# Patient Record
Sex: Male | Born: 1975 | Race: Black or African American | Hispanic: No | Marital: Married | Smoking: Current every day smoker
Health system: Southern US, Community
[De-identification: ages and names within clinical notes are randomized; demographics above are authoritative.]

## PROBLEM LIST (undated history)

## (undated) DIAGNOSIS — I10 Essential (primary) hypertension: Secondary | ICD-10-CM

## (undated) DIAGNOSIS — T7840XA Allergy, unspecified, initial encounter: Secondary | ICD-10-CM

## (undated) HISTORY — DX: Allergy, unspecified, initial encounter: T78.40XA

---

## 2004-07-10 ENCOUNTER — Emergency Department: Payer: Self-pay | Admitting: Emergency Medicine

## 2004-11-01 ENCOUNTER — Ambulatory Visit: Payer: Self-pay | Admitting: Pain Medicine

## 2004-11-20 ENCOUNTER — Ambulatory Visit: Payer: Self-pay | Admitting: Pain Medicine

## 2005-01-15 ENCOUNTER — Ambulatory Visit: Payer: Self-pay | Admitting: Physician Assistant

## 2005-01-28 ENCOUNTER — Ambulatory Visit: Payer: Self-pay | Admitting: Physician Assistant

## 2005-01-29 ENCOUNTER — Ambulatory Visit: Payer: Self-pay | Admitting: Pain Medicine

## 2007-11-21 ENCOUNTER — Emergency Department: Payer: Self-pay | Admitting: Emergency Medicine

## 2008-08-11 ENCOUNTER — Emergency Department: Payer: Self-pay | Admitting: Emergency Medicine

## 2010-09-09 ENCOUNTER — Emergency Department: Payer: Self-pay | Admitting: Emergency Medicine

## 2011-01-27 ENCOUNTER — Emergency Department: Payer: Self-pay | Admitting: Emergency Medicine

## 2013-11-17 ENCOUNTER — Emergency Department: Payer: Self-pay | Admitting: Emergency Medicine

## 2013-11-17 LAB — COMPREHENSIVE METABOLIC PANEL
ALT: 72 U/L — AB
ANION GAP: 8 (ref 7–16)
AST: 40 U/L — AB (ref 15–37)
Albumin: 3.6 g/dL (ref 3.4–5.0)
Alkaline Phosphatase: 121 U/L — ABNORMAL HIGH
BILIRUBIN TOTAL: 0.4 mg/dL (ref 0.2–1.0)
BUN: 9 mg/dL (ref 7–18)
CREATININE: 1.01 mg/dL (ref 0.60–1.30)
Calcium, Total: 8.4 mg/dL — ABNORMAL LOW (ref 8.5–10.1)
Chloride: 103 mmol/L (ref 98–107)
Co2: 26 mmol/L (ref 21–32)
EGFR (African American): >60
EGFR (Non-African Amer.): >60
Glucose: 91 mg/dL (ref 65–99)
Osmolality: 272 (ref 275–301)
POTASSIUM: 3.4 mmol/L — AB (ref 3.5–5.1)
Sodium: 137 mmol/L (ref 136–145)
Total Protein: 7.7 g/dL (ref 6.4–8.2)

## 2013-11-17 LAB — URINALYSIS, COMPLETE
BACTERIA: NONE SEEN
Bilirubin,UR: NEGATIVE
Blood: NEGATIVE
Glucose,UR: NEGATIVE mg/dL (ref 0–75)
KETONE: NEGATIVE
LEUKOCYTE ESTERASE: NEGATIVE
Nitrite: NEGATIVE
Ph: 5 (ref 4.5–8.0)
Protein: NEGATIVE
RBC, UR: NONE SEEN /HPF (ref 0–5)
SQUAMOUS EPITHELIAL: NONE SEEN
Specific Gravity: 1.02 (ref 1.003–1.030)

## 2013-11-17 LAB — CBC WITH DIFFERENTIAL/PLATELET
BASOS ABS: 0 10*3/uL (ref 0.0–0.1)
BASOS PCT: 0.3 %
Eosinophil #: 0.4 10*3/uL (ref 0.0–0.7)
Eosinophil %: 3.2 %
HCT: 47.2 % (ref 40.0–52.0)
HGB: 15.4 g/dL (ref 13.0–18.0)
LYMPHS PCT: 24.4 %
Lymphocyte #: 3 10*3/uL (ref 1.0–3.6)
MCH: 27.1 pg (ref 26.0–34.0)
MCHC: 32.7 g/dL (ref 32.0–36.0)
MCV: 83 fL (ref 80–100)
MONO ABS: 1.2 x10 3/mm — AB (ref 0.2–1.0)
Monocyte %: 10 %
NEUTROS PCT: 62.1 %
Neutrophil #: 7.6 10*3/uL — ABNORMAL HIGH (ref 1.4–6.5)
Platelet: 391 10*3/uL (ref 150–440)
RBC: 5.7 10*6/uL (ref 4.40–5.90)
RDW: 14 % (ref 11.5–14.5)
WBC: 12.2 10*3/uL — ABNORMAL HIGH (ref 3.8–10.6)

## 2013-11-17 LAB — LIPASE, BLOOD: Lipase: 165 U/L (ref 73–393)

## 2016-05-31 ENCOUNTER — Emergency Department
Admission: EM | Admit: 2016-05-31 | Discharge: 2016-05-31 | Disposition: A | Discharge status: Home / Self Care | Payer: BLUE CROSS/BLUE SHIELD | Attending: Emergency Medicine | Admitting: Emergency Medicine

## 2016-05-31 ENCOUNTER — Emergency Department: Payer: BLUE CROSS/BLUE SHIELD

## 2016-05-31 DIAGNOSIS — F17210 Nicotine dependence, cigarettes, uncomplicated: Secondary | ICD-10-CM | POA: Insufficient documentation

## 2016-05-31 DIAGNOSIS — R51 Headache: Secondary | ICD-10-CM | POA: Diagnosis present

## 2016-05-31 DIAGNOSIS — I10 Essential (primary) hypertension: Secondary | ICD-10-CM | POA: Insufficient documentation

## 2016-05-31 DIAGNOSIS — R519 Headache, unspecified: Secondary | ICD-10-CM

## 2016-05-31 LAB — COMPREHENSIVE METABOLIC PANEL
ALT: 30 U/L (ref 17–63)
ANION GAP: 7 (ref 5–15)
AST: 29 U/L (ref 15–41)
Albumin: 3.8 g/dL (ref 3.5–5.0)
Alkaline Phosphatase: 72 U/L (ref 38–126)
BUN: 14 mg/dL (ref 6–20)
CALCIUM: 8.8 mg/dL — AB (ref 8.9–10.3)
CHLORIDE: 104 mmol/L (ref 101–111)
CO2: 26 mmol/L (ref 22–32)
Creatinine, Ser: 1.04 mg/dL (ref 0.61–1.24)
GFR calc non Af Amer: >60 mL/min (ref >60)
Glucose, Bld: 113 mg/dL — ABNORMAL HIGH (ref 65–99)
Potassium: 3.5 mmol/L (ref 3.5–5.1)
SODIUM: 137 mmol/L (ref 135–145)
Total Bilirubin: 0.8 mg/dL (ref 0.3–1.2)
Total Protein: 7.6 g/dL (ref 6.5–8.1)

## 2016-05-31 LAB — CBC
HCT: 43.2 % (ref 40.0–52.0)
Hemoglobin: 14.2 g/dL (ref 13.0–18.0)
MCH: 27 pg (ref 26.0–34.0)
MCHC: 32.7 g/dL (ref 32.0–36.0)
MCV: 82.4 fL (ref 80.0–100.0)
Platelets: 310 10*3/uL (ref 150–440)
RBC: 5.25 MIL/uL (ref 4.40–5.90)
RDW: 15.5 % — AB (ref 11.5–14.5)
WBC: 17.2 10*3/uL — ABNORMAL HIGH (ref 3.8–10.6)

## 2016-05-31 LAB — TROPONIN I

## 2016-05-31 MED ORDER — METOCLOPRAMIDE HCL 5 MG/ML IJ SOLN
INTRAMUSCULAR | Status: AC
  Administered 2016-05-31: 10 mg via INTRAVENOUS
  Filled 2016-05-31: qty 2

## 2016-05-31 MED ORDER — BUTALBITAL-APAP-CAFFEINE 50-325-40 MG PO TABS: 1.0000 | ORAL_TABLET | Freq: Four times a day (QID) | ORAL | 0 refills | Status: AC | PRN

## 2016-05-31 MED ORDER — DIPHENHYDRAMINE HCL 50 MG/ML IJ SOLN
INTRAMUSCULAR | Status: AC
  Administered 2016-05-31: 25 mg via INTRAVENOUS
  Filled 2016-05-31: qty 1

## 2016-05-31 MED ORDER — DIPHENHYDRAMINE HCL 50 MG/ML IJ SOLN
25.0000 mg | Freq: Once | INTRAMUSCULAR | Status: AC
  Administered 2016-05-31: 25 mg via INTRAVENOUS

## 2016-05-31 MED ORDER — METOCLOPRAMIDE HCL 5 MG/ML IJ SOLN
10.0000 mg | Freq: Once | INTRAMUSCULAR | Status: AC
  Administered 2016-05-31: 10 mg via INTRAVENOUS

## 2016-05-31 MED ORDER — KETOROLAC TROMETHAMINE 30 MG/ML IJ SOLN
30.0000 mg | Freq: Once | INTRAMUSCULAR | Status: AC
  Administered 2016-05-31: 30 mg via INTRAVENOUS

## 2016-05-31 MED ORDER — SODIUM CHLORIDE 0.9 % IV SOLN
1000.0000 mL | Freq: Once | INTRAVENOUS | Status: AC
  Administered 2016-05-31: 1000 mL via INTRAVENOUS

## 2016-05-31 MED ORDER — KETOROLAC TROMETHAMINE 30 MG/ML IJ SOLN
INTRAMUSCULAR | Status: AC
  Administered 2016-05-31: 30 mg via INTRAVENOUS
  Filled 2016-05-31: qty 1

## 2016-05-31 NOTE — ED Notes (Signed)
ED Provider at bedside. 

## 2016-05-31 NOTE — ED Triage Notes (Signed)
Patient c/o headache and hypertension beginning today. Pt denies hx of the same. Pt reports he woke with headache, it grew more severe as day passed, his employers took his BP at work at 169/120 and was sent home.

## 2016-05-31 NOTE — ED Notes (Signed)
Patient transported to CT 

## 2016-05-31 NOTE — ED Provider Notes (Signed)
[facility] Emergency Department Provider Note       Time seen: ----------------------------------------- 8:00 PM on 05/31/2016 -----------------------------------------     I have reviewed the triage vital signs and the nursing notes.   HISTORY   Chief Complaint Hypertension and Headache    HPI Curtis [name] is a 41 y.o. male who presents to the ED for headache and hypertension beginning today. Patient denies history of same. He reports he woke with headache and agreed more severe as the day passed. Pain seems to be worse in the left side of his head, he had blood pressure obtained at work which revealed blood pressure 169/120. He was sent home presents here for severe left-sided headache. He denies recent illness or other complaints. He does not have a known blood pressure problem.   History reviewed. No pertinent past medical history.  There are no active problems to display for this patient.   History reviewed. No pertinent surgical history.  Allergies Patient has no known allergies.  Social History Social History  Substance Use Topics  . Smoking status: Current Every Day Smoker    Packs/day: 1.00    Types: Cigarettes  . Smokeless tobacco: Never Used  . Alcohol use No     Comment: occassional some day drink    Review of Systems Constitutional: Negative for fever. Eyes: Negative for vision changes ENT:  Negative for congestion, sore throat Cardiovascular: Negative for chest pain. Respiratory: Negative for shortness of breath. Gastrointestinal: Negative for abdominal pain, vomiting and diarrhea. Genitourinary: Negative for dysuria. Musculoskeletal: Negative for back pain. Skin: Negative for rash. Neurological: Positive for headache  All systems negative/normal/unremarkable except as stated in the HPI  ____________________________________________   PHYSICAL EXAM:  VITAL SIGNS: ED Triage Vitals  Enc Vitals Group     BP  05/31/16 1918 (!) 169/96     Pulse Rate 05/31/16 1918 67     Resp 05/31/16 1918 18     Temp --      Temp src --      SpO2 05/31/16 1918 98 %     Weight 05/31/16 1919 275 lb (124.7 kg)     Height 05/31/16 1919 6\' 1"  (1.854 m)     Head Circumference --      Peak Flow --      Pain Score 05/31/16 1924 8     Pain Loc --      Pain Edu? --      Excl. in [place]? --     Constitutional: Alert and oriented. Well appearing and in no distress. Eyes: Conjunctivae are normal. PERRL. Normal extraocular movements.Mild photophobia is noted ENT   Head: Normocephalic and atraumatic.   Nose: No congestion/rhinnorhea.   Mouth/Throat: Mucous membranes are moist.   Neck: No stridor. Cardiovascular: Normal rate, regular rhythm. No murmurs, rubs, or gallops. Respiratory: Normal respiratory effort without tachypnea nor retractions. Breath sounds are clear and equal bilaterally. No wheezes/rales/rhonchi. Gastrointestinal: Soft and nontender. Normal bowel sounds Musculoskeletal: Nontender with normal range of motion in extremities. No lower extremity tenderness nor edema. Neurologic:  Normal speech and language. No gross focal neurologic deficits are appreciated.  Skin:  Skin is warm, dry and intact. No rash noted. Psychiatric: Mood and affect are normal. Speech and behavior are normal.  ____________________________________________  EKG: Interpreted by me. Sinus rhythm rate of 69 bpm, normal PR interval, normal QRS, normal QT. Incomplete right bundle branch block  ____________________________________________  ED COURSE:  Pertinent labs & imaging results that were available  during my care of the patient were reviewed by me and considered in my medical decision making (see chart for details). Patient presents for headache and hypertension, we will assess with labs and imaging as indicated.   Procedures ____________________________________________   LABS (pertinent positives/negatives)  Labs  Reviewed  CBC - Abnormal; Notable for the following:       Result Value   WBC 17.2 (*)    RDW 15.5 (*)    All other components within normal limits  COMPREHENSIVE METABOLIC PANEL - Abnormal; Notable for the following:    Glucose, Bld 113 (*)    Calcium 8.8 (*)    All other components within normal limits  TROPONIN I    RADIOLOGY Images were viewed by me  CT head Is unremarkable ____________________________________________  FINAL ASSESSMENT AND PLAN  Headache, hypertension  Plan: Patient's labs and imaging were dictated above. Patient had presented for headache and high blood pressure. The high blood pressure is likely secondary to the headache. As his headache resolved his blood pressure seemed to come down. I will prescribe some headache medication. He has leukocytosis of uncertain etiology. No other signs of infection. He is stable and referred to primary care for follow-up.   Earleen Newport, MD   Note: This note was generated in part or whole with voice recognition software. Voice recognition is usually quite accurate but there are transcription errors that can and very often do occur. I apologize for any typographical errors that were not detected and corrected.     Earleen Newport, MD 05/31/16 2053

## 2017-04-12 ENCOUNTER — Other Ambulatory Visit: Payer: Self-pay

## 2017-04-12 ENCOUNTER — Emergency Department
Admission: EM | Admit: 2017-04-12 | Discharge: 2017-04-12 | Disposition: A | Discharge status: Home / Self Care | Payer: BLUE CROSS/BLUE SHIELD | Attending: Emergency Medicine | Admitting: Emergency Medicine

## 2017-04-12 ENCOUNTER — Emergency Department: Payer: BLUE CROSS/BLUE SHIELD

## 2017-04-12 ENCOUNTER — Encounter: Payer: Self-pay | Admitting: Emergency Medicine

## 2017-04-12 DIAGNOSIS — Y929 Unspecified place or not applicable: Secondary | ICD-10-CM | POA: Insufficient documentation

## 2017-04-12 DIAGNOSIS — Y999 Unspecified external cause status: Secondary | ICD-10-CM | POA: Diagnosis not present

## 2017-04-12 DIAGNOSIS — Z79899 Other long term (current) drug therapy: Secondary | ICD-10-CM | POA: Diagnosis not present

## 2017-04-12 DIAGNOSIS — S3992XA Unspecified injury of lower back, initial encounter: Secondary | ICD-10-CM | POA: Diagnosis present

## 2017-04-12 DIAGNOSIS — S39012A Strain of muscle, fascia and tendon of lower back, initial encounter: Secondary | ICD-10-CM | POA: Diagnosis not present

## 2017-04-12 DIAGNOSIS — Y939 Activity, unspecified: Secondary | ICD-10-CM | POA: Insufficient documentation

## 2017-04-12 DIAGNOSIS — F17210 Nicotine dependence, cigarettes, uncomplicated: Secondary | ICD-10-CM | POA: Diagnosis not present

## 2017-04-12 DIAGNOSIS — X58XXXA Exposure to other specified factors, initial encounter: Secondary | ICD-10-CM | POA: Diagnosis not present

## 2017-04-12 LAB — URINALYSIS, COMPLETE (UACMP) WITH MICROSCOPIC
BACTERIA UA: NONE SEEN
BILIRUBIN URINE: NEGATIVE
Glucose, UA: NEGATIVE mg/dL
HGB URINE DIPSTICK: NEGATIVE
Ketones, ur: NEGATIVE mg/dL
LEUKOCYTES UA: NEGATIVE
NITRITE: NEGATIVE
PROTEIN: 30 mg/dL — AB
SPECIFIC GRAVITY, URINE: 1.03 (ref 1.005–1.030)
SQUAMOUS EPITHELIAL / LPF: NONE SEEN
pH: 5 (ref 5.0–8.0)

## 2017-04-12 MED ORDER — CYCLOBENZAPRINE HCL 10 MG PO TABS: 10.0000 mg | ORAL_TABLET | Freq: Three times a day (TID) | ORAL | 0 refills | Status: DC | PRN

## 2017-04-12 MED ORDER — KETOROLAC TROMETHAMINE 10 MG PO TABS: 10.0000 mg | ORAL_TABLET | Freq: Four times a day (QID) | ORAL | 0 refills | Status: DC | PRN

## 2017-04-12 MED ORDER — TRAMADOL HCL 50 MG PO TABS: 50.0000 mg | ORAL_TABLET | Freq: Four times a day (QID) | ORAL | 0 refills | Status: DC | PRN

## 2017-04-12 MED ORDER — KETOROLAC TROMETHAMINE 60 MG/2ML IM SOLN
60.0000 mg | Freq: Once | INTRAMUSCULAR | Status: AC
  Administered 2017-04-12: 60 mg via INTRAMUSCULAR
  Filled 2017-04-12: qty 2

## 2017-04-12 NOTE — ED Triage Notes (Signed)
Pt c/o right low back pain near buttocks radiating down right leg. Hx of injury to back several years ago while at work.  Pt states pain started on Thursday.  Pt sitting in wheelchair.  Pt states he was seen at [facility] and was given muscle relaxants and takes bayer back and body tablets as well. Pt states no relief.

## 2017-04-12 NOTE — ED Provider Notes (Signed)
[facility] Emergency Department Provider Note   ____________________________________________   First MD Initiated Contact with Patient 04/12/17 1646     (approximate)  I have reviewed the triage vital signs and the nursing notes.   HISTORY  Chief Complaint Back Pain    HPI Curtis [name] is a 42 y.o. male patient complain of left radicular back pain has increased in the last 2 days.  It was noted triage stated pain was on the right side.  Patient denies any provocative incident for complaint.  Patient today went to work regularly on Thursday was mild back pain that increased by the time he finished work.  Patient state he went to urgent care clinic and was given muscle relaxers and told to take anti-inflammatory medication.  Patient state no relief with those medications.  Patient denies bladder bowel dysfunction.  Patient rates the pain as a 10/10.  Patient described the pain is "achy".  History reviewed. No pertinent past medical history.  There are no active problems to display for this patient.   History reviewed. No pertinent surgical history.  Prior to Admission medications   Medication Sig Start Date End Date Taking? Authorizing Provider  butalbital-acetaminophen-caffeine (FIORICET, ESGIC) [phone] MG tablet Take 1-2 tablets by mouth every 6 (six) hours as needed for headache. 05/31/16 05/31/17  Earleen Newport, MD  cyclobenzaprine (FLEXERIL) 10 MG tablet Take 1 tablet (10 mg total) by mouth 3 (three) times daily as needed. 04/12/17   Sable Feil, PA-C  ketorolac (TORADOL) 10 MG tablet Take 1 tablet (10 mg total) by mouth every 6 (six) hours as needed. 04/12/17   Sable Feil, PA-C  traMADol (ULTRAM) 50 MG tablet Take 1 tablet (50 mg total) by mouth every 6 (six) hours as needed. 04/12/17 04/12/18  Sable Feil, PA-C    Allergies Patient has no known allergies.  Family History  Problem Relation Age of Onset  . Diabetes Maternal  Uncle   . Diabetes Paternal Uncle     Social History Social History   Tobacco Use  . Smoking status: Current Every Day Smoker    Packs/day: 1.00    Types: Cigarettes  . Smokeless tobacco: Never Used  Substance Use Topics  . Alcohol use: No    Comment: occassional some day drink  . Drug use: No    Review of Systems Constitutional: No fever/chills Eyes: No visual changes. ENT: No sore throat. Cardiovascular: Denies chest pain. Respiratory: Denies shortness of breath. Gastrointestinal: No abdominal pain.  No nausea, no vomiting.  No diarrhea.  No constipation. Genitourinary: Negative for dysuria. Musculoskeletal: Positive for back pain. Skin: Negative for rash. Neurological: Negative for headaches, focal weakness or numbness.   ____________________________________________   PHYSICAL EXAM:  VITAL SIGNS: ED Triage Vitals  Enc Vitals Group     BP 04/12/17 1553 139/85     Pulse Rate 04/12/17 1553 77     Resp 04/12/17 1553 18     Temp 04/12/17 1553 98.3 F (36.8 C)     Temp Source 04/12/17 1553 Oral     SpO2 04/12/17 1553 98 %     Weight 04/12/17 1553 253 lb (114.8 kg)     Height 04/12/17 1553 6\' 2"  (1.88 m)     Head Circumference --      Peak Flow --      Pain Score 04/12/17 1601 10     Pain Loc --      Pain Edu? --  Excl. in [place]? --    Constitutional: Alert and oriented. Well appearing and in no acute distress. Cardiovascular: Normal rate, regular rhythm. Grossly normal heart sounds.  Good peripheral circulation. Respiratory: Normal respiratory effort.  No retractions. Lungs CTAB. Gastrointestinal: Soft and nontender. No distention. No abdominal bruits. No CVA tenderness. Musculoskeletal: No obvious spinal deformity.  No guarding palpation of the spinal processes.  Moderate guarding palpation left paraspinal muscle area.  Patient has negative straight leg test.  Patient states since then we have relies on upper extremity.. Neurologic:  Normal speech and  language. No gross focal neurologic deficits are appreciated. No gait instability. Skin:  Skin is warm, dry and intact. No rash noted. Psychiatric: Mood and affect are normal. Speech and behavior are normal.  ____________________________________________   LABS (all labs ordered are listed, but only abnormal results are displayed)  Labs Reviewed  URINALYSIS, COMPLETE (UACMP) WITH MICROSCOPIC - Abnormal; Notable for the following components:      Result Value   Color, Urine YELLOW (*)    APPearance CLEAR (*)    Protein, ur 30 (*)    All other components within normal limits   ____________________________________________  EKG   ____________________________________________  RADIOLOGY No acute findings x-ray of the lumbar spine.  Official radiology report(s): Dg Lumbar Spine Complete  Result Date: 04/12/2017 CLINICAL DATA:  Worsening low back pain. Radiation to the right leg. EXAM: LUMBAR SPINE - COMPLETE 4+ VIEW COMPARISON:  None. FINDINGS: Normal lumbar segmentation. No pars interarticularis defect. Alignment is normal. Vertebral body heights and intervertebral disc spaces are normal. Normal facet articulations. IMPRESSION: Normal lumbar spine radiographs. Electronically Signed   By: Ulyses Jarred M.D.   On: 04/12/2017 17:51    ____________________________________________   PROCEDURES  Procedure(s) performed: None  Procedures  Critical Care performed: No  ____________________________________________   INITIAL IMPRESSION / ASSESSMENT AND PLAN / ED COURSE  As part of my medical decision making, I reviewed the following data within the electronic MEDICAL RECORD NUMBER    Back pain secondary to lumbar strain.  Discussed negative x-ray findings with patient.  Patient given discharge care instruction.  Patient advised take medication as directed and follow-up with the [facility] if no improvement in 3 days.       ____________________________________________   FINAL CLINICAL IMPRESSION(S) / ED DIAGNOSES  Final diagnoses:  Strain of lumbar region, initial encounter     ED Discharge Orders        Ordered    ketorolac (TORADOL) 10 MG tablet  Every 6 hours PRN     04/12/17 1806    cyclobenzaprine (FLEXERIL) 10 MG tablet  3 times daily PRN     04/12/17 1806    traMADol (ULTRAM) 50 MG tablet  Every 6 hours PRN     04/12/17 1806       Note:  This document was prepared using Dragon voice recognition software and may include unintentional dictation errors.    Sable Feil, PA-C 04/12/17 1811    Arta Silence, MD 04/12/17 [phone]

## 2018-01-11 ENCOUNTER — Encounter: Payer: Self-pay | Admitting: Emergency Medicine

## 2018-01-11 ENCOUNTER — Other Ambulatory Visit: Payer: Self-pay

## 2018-01-11 ENCOUNTER — Emergency Department
Admission: EM | Admit: 2018-01-11 | Discharge: 2018-01-11 | Disposition: A | Discharge status: Home / Self Care | Payer: BLUE CROSS/BLUE SHIELD | Attending: Emergency Medicine | Admitting: Emergency Medicine

## 2018-01-11 DIAGNOSIS — R21 Rash and other nonspecific skin eruption: Secondary | ICD-10-CM | POA: Diagnosis present

## 2018-01-11 DIAGNOSIS — F17210 Nicotine dependence, cigarettes, uncomplicated: Secondary | ICD-10-CM | POA: Insufficient documentation

## 2018-01-11 MED ORDER — PREDNISONE 10 MG PO TABS: ORAL_TABLET | ORAL | 0 refills | Status: DC

## 2018-01-11 MED ORDER — DEXAMETHASONE SODIUM PHOSPHATE 10 MG/ML IJ SOLN
10.0000 mg | Freq: Once | INTRAMUSCULAR | Status: AC
  Administered 2018-01-11: 10 mg via INTRAMUSCULAR
  Filled 2018-01-11: qty 1

## 2018-01-11 MED ORDER — HYDROXYZINE HCL 25 MG PO TABS: 25.0000 mg | ORAL_TABLET | Freq: Four times a day (QID) | ORAL | 0 refills | Status: DC | PRN

## 2018-01-11 NOTE — Discharge Instructions (Signed)
Follow-up with 1 of the dermatologist listed on your discharge papers.  Begin taking prednisone tomorrow as directed.  You may also take Atarax 25 mg every 6 hours if needed for itching.  This medication could cause drowsiness and increase your risk for injury.  Do not take this medication while driving.  You may continue using over-the-counter creams including hydrocortisone cream as needed.

## 2018-01-11 NOTE — ED Provider Notes (Signed)
[facility] Emergency Department Provider Note   ____________________________________________   None    (approximate)  I have reviewed the triage vital signs and the nursing notes.   HISTORY  Chief Complaint Rash   HPI Curtis [name] is a 42 y.o. male presents to the ED with complaint of rash for 1 month.  Patient states that the rash has been itching and keeps him awake at night.  He has tried calamine and other topical medications without any relief.  Patient states he did have a history of eczema in the past.  Patient denies any fever, chills, nausea or vomiting with this rash.  History reviewed. No pertinent past medical history.  There are no active problems to display for this patient.   History reviewed. No pertinent surgical history.  Prior to Admission medications   Medication Sig Start Date End Date Taking? Authorizing Provider  hydrOXYzine (ATARAX/VISTARIL) 25 MG tablet Take 1 tablet (25 mg total) by mouth every 6 (six) hours as needed for itching. 01/11/18   Johnn Hai, PA-C  predniSONE (DELTASONE) 10 MG tablet Take 6 tablets  today, on day 2 take 5 tablets, day 3 take 4 tablets, day 4 take 3 tablets, day 5 take  2 tablets and 1 tablet the last day 01/11/18   Johnn Hai, PA-C    Allergies Patient has no known allergies.  Family History  Problem Relation Age of Onset  . Diabetes Maternal Uncle   . Diabetes Paternal Uncle     Social History Social History   Tobacco Use  . Smoking status: Current Every Day Smoker    Packs/day: 1.00    Types: Cigarettes  . Smokeless tobacco: Never Used  Substance Use Topics  . Alcohol use: No    Comment: occassional some day drink  . Drug use: No    Review of Systems Constitutional: No fever/chills Eyes: No visual changes. ENT: No sore throat. Cardiovascular: Denies chest pain. Respiratory: Denies shortness of breath. Gastrointestinal: No abdominal pain.  No nausea, no  vomiting.  Musculoskeletal: Negative for muscle or joint pain. Skin: Positive for rash. Neurological: Negative for headaches, focal weakness or numbness. ___________________________________________   PHYSICAL EXAM:  VITAL SIGNS: ED Triage Vitals  Enc Vitals Group     BP 01/11/18 1739 137/90     Pulse Rate 01/11/18 1739 76     Resp 01/11/18 1739 12     Temp 01/11/18 1739 98.5 F (36.9 C)     Temp Source 01/11/18 1739 Oral     SpO2 01/11/18 1739 98 %     Weight 01/11/18 1739 260 lb (117.9 kg)     Height 01/11/18 1739 6\' 1"  (1.854 m)     Head Circumference --      Peak Flow --      Pain Score 01/11/18 1746 0     Pain Loc --      Pain Edu? --      Excl. in [place]? --    Constitutional: Alert and oriented. Well appearing and in no acute distress. Eyes: Conjunctivae are normal.  Head: Atraumatic. Nose: No congestion/rhinnorhea. Mouth/Throat: Mucous membranes are moist.  Oropharynx non-erythematous. Neck: No stridor.   Cardiovascular: Normal rate, regular rhythm. Grossly normal heart sounds.  Good peripheral circulation. Respiratory: Normal respiratory effort.  No retractions. Lungs CTAB. Musculoskeletal: Moves upper and lower extremities without any difficulty.  There is no joint edema or erythema noted.  Patient is able to move and ambulate without any  difficulties. Neurologic:  Normal speech and language. No gross focal neurologic deficits are appreciated. No gait instability. Skin:  Skin is warm, dry.  There are multiple dry, flaky macular areas and irregular shapes diffusely on the trunk, upper and lower extremities. Psychiatric: Mood and affect are normal. Speech and behavior are normal.  ____________________________________________   LABS (all labs ordered are listed, but only abnormal results are displayed)  Labs Reviewed - No data to display  PROCEDURES  Procedure(s) performed: None  Procedures  Critical Care performed:  No  ____________________________________________   INITIAL IMPRESSION / ASSESSMENT AND PLAN / ED COURSE  As part of my medical decision making, I reviewed the following data within the electronic MEDICAL RECORD NUMBER Notes from prior ED visits and [place] Controlled Substance Database  Patient presents to the ED with complaint of 1 month history of rash that is itching.  Patient has been using over-the-counter medication without any relief.  He has a history of eczema in the past which appears to be similar.  Physical exam shows multiple erythematous, scaly, irregular patches on the trunk and extremities.  Patient was given Decadron 10 mg IM while in the department and a prescription for a tapering dose of prednisone.  He is encouraged to follow-up with a dermatologist in [place] and the 2 offices were listed on his discharge papers.  Patient will continue using over-the-counter topical creams for comfort.  He was also given a prescription for Atarax if needed for itching.  ____________________________________________   FINAL CLINICAL IMPRESSION(S) / ED DIAGNOSES  Final diagnoses:  Rash and nonspecific skin eruption     ED Discharge Orders         Ordered    predniSONE (DELTASONE) 10 MG tablet     01/11/18 1852    hydrOXYzine (ATARAX/VISTARIL) 25 MG tablet  Every 6 hours PRN     01/11/18 1852           Note:  This document was prepared using Dragon voice recognition software and may include unintentional dictation errors.    Johnn Hai, PA-C 01/11/18 2126    Lavonia Drafts, MD 01/11/18 2230

## 2018-01-11 NOTE — ED Triage Notes (Signed)
Pt to ED via POV for rash pt states that the rash has been there for for about 1 month. Pt states that he has been using OTC medications without relief. Pt states that the rash is itchy. Pt denies any changes in environment or medications. Pt is in NAD at this time.

## 2018-01-11 NOTE — ED Notes (Signed)
See triage note. Pt has dark 1cm-1in spots all over body. Pt states they are itchy and sometimes burn. Started in Nov and pt states he can't take it anymore. Pt currently sitting in bed resting. Denies pain/fever/any other symptoms/trigger/allergies.

## 2018-02-16 ENCOUNTER — Emergency Department
Admission: EM | Admit: 2018-02-16 | Discharge: 2018-02-16 | Disposition: A | Discharge status: Home / Self Care | Payer: BLUE CROSS/BLUE SHIELD | Attending: Emergency Medicine | Admitting: Emergency Medicine

## 2018-02-16 ENCOUNTER — Encounter: Payer: Self-pay | Admitting: *Deleted

## 2018-02-16 ENCOUNTER — Other Ambulatory Visit: Payer: Self-pay

## 2018-02-16 ENCOUNTER — Emergency Department: Payer: BLUE CROSS/BLUE SHIELD

## 2018-02-16 DIAGNOSIS — M79602 Pain in left arm: Secondary | ICD-10-CM | POA: Insufficient documentation

## 2018-02-16 DIAGNOSIS — M5412 Radiculopathy, cervical region: Secondary | ICD-10-CM | POA: Insufficient documentation

## 2018-02-16 DIAGNOSIS — Z79899 Other long term (current) drug therapy: Secondary | ICD-10-CM | POA: Diagnosis not present

## 2018-02-16 DIAGNOSIS — F17210 Nicotine dependence, cigarettes, uncomplicated: Secondary | ICD-10-CM | POA: Insufficient documentation

## 2018-02-16 LAB — CBC
HCT: 44.9 % (ref 39.0–52.0)
Hemoglobin: 14.7 g/dL (ref 13.0–17.0)
MCH: 27.1 pg (ref 26.0–34.0)
MCHC: 32.7 g/dL (ref 30.0–36.0)
MCV: 82.8 fL (ref 80.0–100.0)
NRBC: 0 % (ref 0.0–0.2)
PLATELETS: 358 10*3/uL (ref 150–400)
RBC: 5.42 MIL/uL (ref 4.22–5.81)
RDW: 14.4 % (ref 11.5–15.5)
WBC: 14.1 10*3/uL — ABNORMAL HIGH (ref 4.0–10.5)

## 2018-02-16 LAB — BASIC METABOLIC PANEL
Anion gap: 7 (ref 5–15)
BUN: 17 mg/dL (ref 6–20)
CALCIUM: 9.1 mg/dL (ref 8.9–10.3)
CO2: 26 mmol/L (ref 22–32)
CREATININE: 0.9 mg/dL (ref 0.61–1.24)
Chloride: 105 mmol/L (ref 98–111)
GFR calc non Af Amer: >60 mL/min (ref >60)
Glucose, Bld: 138 mg/dL — ABNORMAL HIGH (ref 70–99)
Potassium: 4.8 mmol/L (ref 3.5–5.1)
SODIUM: 138 mmol/L (ref 135–145)

## 2018-02-16 LAB — TROPONIN I: Troponin I: <0.03 ng/mL (ref <0.03)

## 2018-02-16 MED ORDER — IBUPROFEN 600 MG PO TABS: ORAL_TABLET | ORAL | 0 refills | Status: DC

## 2018-02-16 MED ORDER — PREDNISONE 20 MG PO TABS: 60.0000 mg | ORAL_TABLET | Freq: Every day | ORAL | 0 refills | Status: DC

## 2018-02-16 MED ORDER — HYDROCODONE-ACETAMINOPHEN 5-325 MG PO TABS: 2.0000 | ORAL_TABLET | Freq: Four times a day (QID) | ORAL | 0 refills | Status: DC | PRN

## 2018-02-16 MED ORDER — OMEPRAZOLE MAGNESIUM 20 MG PO TBEC: 20.0000 mg | DELAYED_RELEASE_TABLET | Freq: Every day | ORAL | 1 refills | Status: DC

## 2018-02-16 MED ORDER — LIDOCAINE 5 % EX PTCH: 1.0000 | MEDICATED_PATCH | Freq: Two times a day (BID) | CUTANEOUS | 0 refills | Status: AC

## 2018-02-16 MED ORDER — LIDOCAINE 5 % EX PTCH
1.0000 | MEDICATED_PATCH | CUTANEOUS | Status: DC
  Administered 2018-02-16: 1 via TRANSDERMAL
  Filled 2018-02-16: qty 1

## 2018-02-16 NOTE — ED Notes (Signed)
Pt verbalized understanding of d/c instructions, rx, and f/u care. No further questions at this time, pt ambulatory to the exit with steady gait.

## 2018-02-16 NOTE — Discharge Instructions (Signed)
As we discussed I believe that you have pulled some muscles in your left shoulder which is resulting in some pressure on the nerve that runs down from your neck down the left arm.  This results in a painful condition called cervical radiculopathy.  Please read through the included information.  Try using the medications prescribed and I believe that you will be feeling better within about a week.  Try to gently stretch the arm but try not to overwork it until it is feeling better.  Please note that if you take both the prescribed prednisone and ibuprofen/Motrin/Advil, it is important that you take the medication with food to protect your stomach as well as taking an over-the-counter medicine (I also provided a prescription) such as omeprazole (Prilosec) help protect your stomach.  Follow-up with your doctor or with an orthopedic specialist at the next available opportunity.  Return to the emergency department if you develop new or worsening symptoms that concern you.  Take Norco as prescribed for severe pain. Do not drink alcohol, drive or participate in any other potentially dangerous activities while taking this medication as it may make you sleepy. Do not take this medication with any other sedating medications, either prescription or over-the-counter. If you were prescribed Percocet or Vicodin, do not take these with acetaminophen (Tylenol) as it is already contained within these medications.   This medication is an opiate (or narcotic) pain medication and can be habit forming.  Use it as little as possible to achieve adequate pain control.  Do not use or use it with extreme caution if you have a history of opiate abuse or dependence.  If you are on a pain contract with your primary care doctor or a pain specialist, be sure to let them know you were prescribed this medication today from the [facility] Emergency Department.  This medication is intended for your use only - do not give any to anyone  else and keep it in a secure place where nobody else, especially children, have access to it.  It will also cause or worsen constipation, so you may want to consider taking an over-the-counter stool softener while you are taking this medication.

## 2018-02-16 NOTE — ED Provider Notes (Signed)
[facility] Emergency Department Provider Note  ____________________________________________   First MD Initiated Contact with Patient 02/16/18 2255     (approximate)  I have reviewed the triage vital signs and the nursing notes.   HISTORY  Chief Complaint Arm Pain    HPI Curtis [name] is a 43 y.o. male who reports no chronic medical issues and presents for evaluation of acute onset pain that starts up high in his left shoulder near his neck and radiates all the way down the left arm to his hand.  He awoke with the pain early this morning and did not have it before he went to bed.  It has been constant all throughout the day.  It is worse with moving his left arm and shoulder and a little bit better with rest.  He was hoping it would go away over the course of the day but if anything it seems to have gotten worse.  He describes it as an aching pain that has become severe.  He denies any other symptoms including fever/chills, chest pain, shortness of breath, abdominal pain, nausea, vomiting, and dysuria.  He has no numbness nor tingling in his extremities and he has no weakness in the affected arm.  He has no history of trauma of which he is aware.  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  He denies a history of hypertension, diabetes, coronary artery disease.  He uses tobacco.  No past medical history on file.  There are no active problems to display for this patient.   No past surgical history on file.  Prior to Admission medications   Medication Sig Start Date End Date Taking? Authorizing Provider  HYDROcodone-acetaminophen (NORCO/VICODIN) 5-325 MG tablet Take 2 tablets by mouth every 6 (six) hours as needed for moderate pain or severe pain. 02/16/18   Hinda Kehr, MD  hydrOXYzine (ATARAX/VISTARIL) 25 MG tablet Take 1 tablet (25 mg total) by mouth every 6 (six)  hours as needed for itching. 01/11/18   Johnn Hai, PA-C  ibuprofen (ADVIL,MOTRIN) 600 MG tablet Take 1 tablet by mouth three times daily with meals 02/16/18   Hinda Kehr, MD  lidocaine (LIDODERM) 5 % Place 1 patch onto the skin every 12 (twelve) hours. Remove & Discard patch within 12 hours or as directed by MD.  Pershing Proud the patch off for 12 hours before applying a new one. 02/16/18 02/16/19  Hinda Kehr, MD  omeprazole (PRILOSEC OTC) 20 MG tablet Take 1 tablet (20 mg total) by mouth daily. 02/16/18 02/16/19  Hinda Kehr, MD  predniSONE (DELTASONE) 20 MG tablet Take 3 tablets (60 mg total) by mouth daily. 02/16/18   Hinda Kehr, MD    Allergies Patient has no known allergies.  Family History  Problem Relation Age of Onset  . Diabetes Maternal Uncle   . Diabetes Paternal Uncle     Social History Social History   Tobacco Use  . Smoking status: Current Every Day Smoker    Packs/day: 1.00    Types: Cigarettes  . Smokeless tobacco: Never Used  Substance Use Topics  . Alcohol use: No    Comment: occassional some day drink  . Drug use: No    Review of Systems Constitutional: No fever/chills Eyes: No visual changes. ENT: No sore throat. Cardiovascular: Denies chest pain. Respiratory: Denies shortness of breath. Gastrointestinal: No abdominal pain.  No  nausea, no vomiting.  No diarrhea.  No constipation. Genitourinary: Negative for dysuria. Musculoskeletal: Pain throughout left arm as described above. Integumentary: Negative for rash. Neurological: Negative for headaches, focal weakness or numbness.  ____________________________________________   PHYSICAL EXAM:  VITAL SIGNS: ED Triage Vitals  Enc Vitals Group     BP 02/16/18 2000 (!) 143/71     Pulse Rate 02/16/18 2000 (!) 106     Resp 02/16/18 2000 18     Temp 02/16/18 2000 98.4 F (36.9 C)     Temp Source 02/16/18 2000 Oral     SpO2 02/16/18 2000 98 %     Weight 02/16/18 2000 117.9 kg (260 lb)     Height 02/16/18  2000 1.88 m (6\' 2" )     Head Circumference --      Peak Flow --      Pain Score 02/16/18 2007 8     Pain Loc --      Pain Edu? --      Excl. in [place]? --     Constitutional: Alert and oriented. Well appearing and in no acute distress. Eyes: Conjunctivae are normal.  Head: Atraumatic. Nose: No congestion/rhinnorhea. Mouth/Throat: Mucous membranes are moist. Neck: No stridor.  No meningeal signs.   Cardiovascular: Normal rate, regular rhythm. Good peripheral circulation. Grossly normal heart sounds. Respiratory: Normal respiratory effort.  No retractions. Lungs CTAB. Gastrointestinal: Soft and nontender. No distention.  Musculoskeletal: Highly reproducible pain in the left arm most notable up high in the shoulder.  It is reproducible with movement of the left arm including abduction and extension of the left shoulder as well as reaching across his body to the right shoulder.  There is no gross deformity and no specific bony tenderness.  No tenderness nor step-off/deformities appreciated on palpation of the cervical spine. Neurologic:  Normal speech and language. No gross focal neurologic deficits are appreciated.  He has good and equal muscle strength throughout his extremities and there is no deficit appreciated in the left arm. Skin:  Skin is warm, dry and intact. No rash noted. Psychiatric: Mood and affect are normal. Speech and behavior are normal.  ____________________________________________   LABS (all labs ordered are listed, but only abnormal results are displayed)  Labs Reviewed  BASIC METABOLIC PANEL - Abnormal; Notable for the following components:      Result Value   Glucose, Bld 138 (*)    All other components within normal limits  CBC - Abnormal; Notable for the following components:   WBC 14.1 (*)    All other components within normal limits  TROPONIN I   ____________________________________________  EKG  ED ECG REPORT I, Hinda Kehr, the attending physician,  personally viewed and interpreted this ECG.  Date: 02/16/2018 EKG Time: 20: 15 Rate: 99 Rhythm: normal sinus rhythm QRS Axis: RAD Intervals: normal ST/T Wave abnormalities: normal Narrative Interpretation: no evidence of acute ischemia  ____________________________________________  RADIOLOGY I, Hinda Kehr, personally viewed and evaluated these images (plain radiographs) as part of my medical decision making, as well as reviewing the written report by the radiologist.  ED MD interpretation: No acute abnormality on chest x-ray  Official radiology report(s): Dg Chest 2 View  Result Date: 02/16/2018 CLINICAL DATA:  Left arm pain. EXAM: CHEST - 2 VIEW COMPARISON:  None. FINDINGS: The heart size and mediastinal contours are within normal limits. Both lungs are clear. The visualized skeletal structures are unremarkable. IMPRESSION: Negative two view chest x-ray Electronically Signed   By: Wynetta Fines.D.  On: 02/16/2018 20:29    ____________________________________________   PROCEDURES  Critical Care performed: No   Procedure(s) performed:   Procedures   ____________________________________________   INITIAL IMPRESSION / ASSESSMENT AND PLAN / ED COURSE  As part of my medical decision making, I reviewed the following data within the [facility] notes reviewed and incorporated, Labs reviewed , EKG interpreted , Old chart reviewed, Radiograph reviewed  and Notes from prior ED visits    Differential diagnosis includes, but is not limited to, cervical radiculopathy, musculoskeletal pain/strain, AC joint separation, shoulder dislocation or fracture, ACS.  The patient was worked up in triage is primarily an ACS rule out, but his signs and symptoms are much more consistent with musculoskeletal pain.  Given the presence of the pain that starts up on his shoulder and radiates all the way down to his hand without any loss of strength or neurological  deficit, I believe that he is suffering from a degree of cervical radiculopathy.  I discussed this with him and explained why I did not think that any imaging of his shoulder or of his neck is indicated at this time.  We discussed conservative treatment options and I have prescribed him medication as listed below.  I encouraged follow-up with orthopedics and I gave my usual customary return precautions.  He understands and agrees with the plan.  He has no findings in the [place] controlled substance database.      ____________________________________________  FINAL CLINICAL IMPRESSION(S) / ED DIAGNOSES  Final diagnoses:  Left arm pain  Cervical radiculopathy     MEDICATIONS GIVEN DURING THIS VISIT:  Medications  lidocaine (LIDODERM) 5 % 1 patch (1 patch Transdermal Patch Applied 02/16/18 2324)     ED Discharge Orders         Ordered    lidocaine (LIDODERM) 5 %  Every 12 hours     02/16/18 2319    HYDROcodone-acetaminophen (NORCO/VICODIN) 5-325 MG tablet  Every 6 hours PRN     02/16/18 2319    predniSONE (DELTASONE) 20 MG tablet  Daily     02/16/18 2319    omeprazole (PRILOSEC OTC) 20 MG tablet  Daily     02/16/18 2319    ibuprofen (ADVIL,MOTRIN) 600 MG tablet     02/16/18 2319           Note:  This document was prepared using Dragon voice recognition software and may include unintentional dictation errors.   Hinda Kehr, MD 02/16/18 [phone]

## 2018-02-16 NOTE — ED Triage Notes (Signed)
Pt reports waking up at 0530 today with left arm pain.  No chest pain or sob.  No back pain.  No neck pain.  No injury to left arm  Pt alert  Speech clear.

## 2018-02-16 NOTE — ED Notes (Signed)
Provider at bedside assessing pt at this time.

## 2018-10-09 ENCOUNTER — Emergency Department
Admission: EM | Admit: 2018-10-09 | Discharge: 2018-10-09 | Disposition: A | Discharge status: Home / Self Care | Payer: BC Managed Care – PPO | Attending: Emergency Medicine | Admitting: Emergency Medicine

## 2018-10-09 ENCOUNTER — Other Ambulatory Visit: Payer: Self-pay

## 2018-10-09 DIAGNOSIS — M5416 Radiculopathy, lumbar region: Secondary | ICD-10-CM | POA: Diagnosis not present

## 2018-10-09 DIAGNOSIS — Z79899 Other long term (current) drug therapy: Secondary | ICD-10-CM | POA: Diagnosis not present

## 2018-10-09 DIAGNOSIS — F17210 Nicotine dependence, cigarettes, uncomplicated: Secondary | ICD-10-CM | POA: Insufficient documentation

## 2018-10-09 DIAGNOSIS — M545 Low back pain: Secondary | ICD-10-CM | POA: Diagnosis present

## 2018-10-09 MED ORDER — PREDNISONE 20 MG PO TABS
60.0000 mg | ORAL_TABLET | Freq: Once | ORAL | Status: AC
  Administered 2018-10-09: 23:00:00 60 mg via ORAL
  Filled 2018-10-09: qty 3

## 2018-10-09 MED ORDER — CYCLOBENZAPRINE HCL 5 MG PO TABS: 5.0000 mg | ORAL_TABLET | Freq: Three times a day (TID) | ORAL | 0 refills | Status: DC | PRN

## 2018-10-09 MED ORDER — PREDNISONE 10 MG PO TABS: ORAL_TABLET | ORAL | 0 refills | Status: DC

## 2018-10-09 MED ORDER — CYCLOBENZAPRINE HCL 10 MG PO TABS
10.0000 mg | ORAL_TABLET | Freq: Once | ORAL | Status: AC
  Administered 2018-10-09: 10 mg via ORAL
  Filled 2018-10-09: qty 1

## 2018-10-09 MED ORDER — METHOCARBAMOL 1000 MG/10ML IJ SOLN
750.0000 mg | Freq: Once | INTRAMUSCULAR | Status: DC
  Filled 2018-10-09: qty 7.5

## 2018-10-09 MED ORDER — HYDROMORPHONE HCL 1 MG/ML IJ SOLN
1.0000 mg | Freq: Once | INTRAMUSCULAR | Status: AC
  Administered 2018-10-09: 23:00:00 1 mg via INTRAMUSCULAR
  Filled 2018-10-09: qty 1

## 2018-10-09 MED ORDER — HYDROCODONE-ACETAMINOPHEN 5-325 MG PO TABS: 1.0000 | ORAL_TABLET | Freq: Four times a day (QID) | ORAL | 0 refills | Status: DC | PRN

## 2018-10-09 NOTE — Discharge Instructions (Signed)
Please take medications as prescribed.  If no improvement in 1 week follow-up with orthopedist.  Return to the ER for any increasing pain weakness worsening symptoms or urgent changes in health.

## 2018-10-09 NOTE — ED Provider Notes (Signed)
[facility] EMERGENCY DEPARTMENT Provider Note   CSN: [id number] Arrival date & time: 10/09/18  2239     History   Chief Complaint Chief Complaint  Patient presents with   Back Pain    HPI Curtis [name] is a 43 y.o. male presents to the emergency department for evaluation of left lumbar radiculopathy.  Has a history of the same symptoms 1 year ago.  Responded well to intramuscular injection of the ER.  Patient states last 2 days he has had pain burning and shooting pain going down his left posterior lateral thigh to the knee and slightly into the left lateral lower leg.  He denies any fevers, abdominal pain, nausea, vomiting, headaches.  No loss of bowel bladder symptoms.  He has not had any medications for pain.  Denies any recent trauma or injury.  Sciatica symptoms today feel similar to the symptoms he had 1 year ago.     HPI  History reviewed. No pertinent past medical history.  There are no active problems to display for this patient.   History reviewed. No pertinent surgical history.      Home Medications    Prior to Admission medications   Medication Sig Start Date End Date Taking? Authorizing Provider  cyclobenzaprine (FLEXERIL) 5 MG tablet Take 1-2 tablets (5-10 mg total) by mouth 3 (three) times daily as needed for muscle spasms. 10/09/18   Duanne Guess, PA-C  HYDROcodone-acetaminophen (NORCO) 5-325 MG tablet Take 1 tablet by mouth every 6 (six) hours as needed for moderate pain. 10/09/18   Duanne Guess, PA-C  hydrOXYzine (ATARAX/VISTARIL) 25 MG tablet Take 1 tablet (25 mg total) by mouth every 6 (six) hours as needed for itching. 01/11/18   Johnn Hai, PA-C  ibuprofen (ADVIL,MOTRIN) 600 MG tablet Take 1 tablet by mouth three times daily with meals 02/16/18   Hinda Kehr, MD  lidocaine (LIDODERM) 5 % Place 1 patch onto the skin every 12 (twelve) hours. Remove & Discard patch within 12 hours or as directed by MD.  Pershing Proud the  patch off for 12 hours before applying a new one. 02/16/18 02/16/19  Hinda Kehr, MD  omeprazole (PRILOSEC OTC) 20 MG tablet Take 1 tablet (20 mg total) by mouth daily. 02/16/18 02/16/19  Hinda Kehr, MD  predniSONE (DELTASONE) 10 MG tablet 10 day taper. 5,5,4,4,3,3,2,2,1,1 10/09/18   Duanne Guess, PA-C    Family History Family History  Problem Relation Age of Onset   Diabetes Maternal Uncle    Diabetes Paternal Uncle     Social History Social History   Tobacco Use   Smoking status: Current Every Day Smoker    Packs/day: 1.00    Types: Cigarettes   Smokeless tobacco: Never Used  Substance Use Topics   Alcohol use: No    Comment: occassional some day drink   Drug use: No     Allergies   Patient has no known allergies.   Review of Systems Review of Systems  Constitutional: Negative.  Negative for activity change, appetite change, chills and fever.  Eyes: Negative for photophobia, pain and discharge.  Respiratory: Negative for cough, chest tightness and shortness of breath.   Cardiovascular: Negative for chest pain and leg swelling.  Gastrointestinal: Negative for abdominal distention, abdominal pain, diarrhea, nausea and vomiting.  Genitourinary: Negative for difficulty urinating, dysuria and flank pain.  Musculoskeletal: Positive for back pain. Negative for arthralgias, gait problem, myalgias and neck pain.  Skin: Negative for color change and rash.  Neurological: Positive for numbness. Negative for dizziness, tremors, syncope, weakness and headaches.  Hematological: Negative for adenopathy.  Psychiatric/Behavioral: Negative for agitation and behavioral problems.     Physical Exam Updated Vital Signs BP (!) 156/100 (BP Location: Left Arm)    Pulse (!) 110    Temp 98.1 F (36.7 C) (Oral)    Resp 18    Ht 6\' 1"  (1.854 m)    Wt 122.5 kg    SpO2 99%    BMI 35.62 kg/m   Physical Exam Constitutional:      Appearance: He is well-developed.  HENT:     Head:  Normocephalic and atraumatic.  Eyes:     Conjunctiva/sclera: Conjunctivae normal.  Neck:     Musculoskeletal: Normal range of motion.  Cardiovascular:     Rate and Rhythm: Normal rate.  Pulmonary:     Effort: Pulmonary effort is normal. No respiratory distress.  Musculoskeletal:     Comments: Lumbar Spine: Examination of the lumbar spine reveals no bony abnormality, no edema, and no ecchymosis.  There is no step off.  The patient has full range of motion of the lumbar spine with flexion and extension.  The patient has normal lateral bend and rotation.  The patient has no pain with range of motion activities.  The patient is non tender along the spinous process.  The patient is non tender along the paravertebral muscles, with no muscle spasms.  The patient is non tender along the iliac crest.  The patient is non tender in the sciatic notch.  The patient is non tender along the Sacroiliac joint.  There is no Coccyx joint tenderness.    Bilateral Lower Extremities: Examination of the lower extremities reveals no bony abnormality, no edema, and no ecchymosis.  The patient has full active and passive range of motion of the hips, knees, and ankles.  There is no discomfort with range of motion exercises.  The patient is non tender along the greater trochanter region.  The patient has a negative Bevelyn Buckles' test bilaterally.  There is normal skin warmth.  There is normal capillary refill bilaterally.    Neurologic: The patient has a positive left straight leg raise.  The patient has normal muscle strength testing for the quadriceps, calves, ankle dorsiflexion, ankle plantarflexion, and extensor hallicus longus.  The patient has sensation that is intact to light touch.    Skin:    General: Skin is warm.     Findings: No rash.  Neurological:     Mental Status: He is alert and oriented to person, place, and time.  Psychiatric:        Behavior: Behavior normal.        Thought Content: Thought content  normal.      ED Treatments / Results  Labs (all labs ordered are listed, but only abnormal results are displayed) Labs Reviewed - No data to display  EKG None  Radiology No results found.  Procedures Procedures (including critical care time)  Medications Ordered in ED Medications  methocarbamol (ROBAXIN) injection 750 mg (has no administration in time range)  HYDROmorphone (DILAUDID) injection 1 mg (1 mg Intramuscular Given 10/09/18 2318)  predniSONE (DELTASONE) tablet 60 mg (60 mg Oral Given 10/09/18 2318)     Initial Impression / Assessment and Plan / ED Course  I have reviewed the triage vital signs and the nursing notes.  Pertinent labs & imaging results that were available during my care of the patient were reviewed by me and considered in  my medical decision making (see chart for details).        43 year old male with left lumbar radiculopathy.  No neurological deficits.  Vital signs are stable.  History of similar pain, left-sided sciatica that responded well to anti-inflammatory medications in the past.  Patient given IM pain medication muscle aches along with p.o. prednisone.  He is sent home with Norco, Flexeril and 10-day steroid taper.  He understands signs symptoms return to ED for.  We will follow-up with orthopedics if no improvement.  He understands signs and symptoms return to ED for. Final Clinical Impressions(s) / ED Diagnoses   Final diagnoses:  Left lumbar radiculopathy    ED Discharge Orders         Ordered    predniSONE (DELTASONE) 10 MG tablet     10/09/18 2308    HYDROcodone-acetaminophen (NORCO) 5-325 MG tablet  Every 6 hours PRN     10/09/18 2308    cyclobenzaprine (FLEXERIL) 5 MG tablet  3 times daily PRN     10/09/18 2308           Duanne Guess, PA-C 10/09/18 2319    Delman Kitten, MD 10/09/18 2349

## 2018-10-09 NOTE — ED Triage Notes (Signed)
Patient c/o lower left back pain radiating down left leg. Patient reports hx of the same. Patient reports last time he received a shot in his back and his pain resolved.

## 2019-08-20 ENCOUNTER — Encounter: Payer: Self-pay | Admitting: Emergency Medicine

## 2019-08-20 ENCOUNTER — Other Ambulatory Visit: Payer: Self-pay

## 2019-08-20 ENCOUNTER — Emergency Department
Admission: EM | Admit: 2019-08-20 | Discharge: 2019-08-20 | Disposition: A | Discharge status: Home / Self Care | Payer: BC Managed Care – PPO | Attending: Emergency Medicine | Admitting: Emergency Medicine

## 2019-08-20 DIAGNOSIS — M25512 Pain in left shoulder: Secondary | ICD-10-CM | POA: Diagnosis present

## 2019-08-20 DIAGNOSIS — M436 Torticollis: Secondary | ICD-10-CM | POA: Diagnosis not present

## 2019-08-20 DIAGNOSIS — F17210 Nicotine dependence, cigarettes, uncomplicated: Secondary | ICD-10-CM | POA: Diagnosis not present

## 2019-08-20 MED ORDER — METHOCARBAMOL 500 MG PO TABS: 1000.0000 mg | ORAL_TABLET | Freq: Four times a day (QID) | ORAL | 0 refills | Status: DC | PRN

## 2019-08-20 MED ORDER — ORPHENADRINE CITRATE 30 MG/ML IJ SOLN
60.0000 mg | Freq: Two times a day (BID) | INTRAMUSCULAR | Status: DC
  Administered 2019-08-20: 60 mg via INTRAMUSCULAR
  Filled 2019-08-20: qty 2

## 2019-08-20 MED ORDER — OXYCODONE-ACETAMINOPHEN 7.5-325 MG PO TABS
1.0000 | ORAL_TABLET | Freq: Once | ORAL | Status: AC
  Administered 2019-08-20: 1 via ORAL
  Filled 2019-08-20: qty 1

## 2019-08-20 MED ORDER — OXYCODONE-ACETAMINOPHEN 7.5-325 MG PO TABS: 1.0000 | ORAL_TABLET | Freq: Four times a day (QID) | ORAL | 0 refills | Status: AC | PRN

## 2019-08-20 NOTE — ED Provider Notes (Signed)
[facility] Emergency Department Provider Note   ____________________________________________   First MD Initiated Contact with Patient 08/20/19 [phone]     (approximate)  I have reviewed the triage vital signs and the nursing notes.   HISTORY  Chief Complaint Torticollis   HPI Curtis [name] is a 44 y.o. male presents to the ED with complaint of left shoulder pain for approximately 2 weeks.  Hip states that 1 week ago he also developed neck pain on the left side which is worse with movement.  Patient denies any recent injury.  He has been taking over-the-counter medication and using an over-the-counter pain patch without any relief.  He denies any paresthesias to his left upper extremity.  His pain as a 9/10.       History reviewed. No pertinent past medical history.  There are no problems to display for this patient.   History reviewed. No pertinent surgical history.  Prior to Admission medications   Medication Sig Start Date End Date Taking? Authorizing Provider  methocarbamol (ROBAXIN) 500 MG tablet Take 2 tablets (1,000 mg total) by mouth every 6 (six) hours as needed. 08/20/19   Johnn Hai, PA-C  oxyCODONE-acetaminophen (PERCOCET) 7.5-325 MG tablet Take 1 tablet by mouth every 6 (six) hours as needed for severe pain. 08/20/19 08/19/20  Johnn Hai, PA-C    Allergies Patient has no known allergies.  Family History  Problem Relation Age of Onset  . Diabetes Maternal Uncle   . Diabetes Paternal Uncle     Social History Social History   Tobacco Use  . Smoking status: Current Every Day Smoker    Packs/day: 1.00    Types: Cigarettes  . Smokeless tobacco: Never Used  Substance Use Topics  . Alcohol use: No    Comment: occassional some day drink  . Drug use: No    Review of Systems Constitutional: No fever/chills Eyes: No visual changes. ENT: No sore throat. Cardiovascular: Denies chest pain. Respiratory: Denies shortness  of breath. Gastrointestinal: No abdominal pain.  No nausea, no vomiting.  Musculoskeletal: Positive for left shoulder pain, cervical pain. Skin: Negative for rash. Neurological: Negative for headaches, focal weakness or numbness. ____________________________________________   PHYSICAL EXAM:  VITAL SIGNS: ED Triage Vitals  Enc Vitals Group     BP 08/20/19 0825 (!) 156/93     Pulse Rate 08/20/19 0825 97     Resp 08/20/19 0825 20     Temp 08/20/19 0825 98.8 F (37.1 C)     Temp Source 08/20/19 0825 Oral     SpO2 08/20/19 0825 97 %     Weight 08/20/19 0824 275 lb (124.7 kg)     Height 08/20/19 0824 6\' 1"  (1.854 m)     Head Circumference --      Peak Flow --      Pain Score 08/20/19 0826 9     Pain Loc --      Pain Edu? --      Excl. in [place]? --     Constitutional: Alert and oriented. Well appearing and in no acute distress. Eyes: Conjunctivae are normal. PERRL. EOMI. Head: Atraumatic. Nose: No congestion/rhinnorhea. Neck: No stridor.  No tenderness on palpation of cervical spine posteriorly.  Range of motion is decreased laterally secondary to pain with the range of motion to the left better than to the right which is slightly restricted. Hematological/Lymphatic/Immunilogical: No cervical lymphadenopathy. Cardiovascular: Normal rate, regular rhythm. Grossly normal heart sounds.  Good peripheral circulation. Respiratory: Normal  respiratory effort.  No retractions. Lungs CTAB. Musculoskeletal: Examination of left shoulder there is no gross deformity and no crepitus is noted with range of motion.  No soft tissue edema or evidence of injury is seen.  Good muscle strength bilaterally. Neurologic:  Normal speech and language. No gross focal neurologic deficits are appreciated. No gait instability. Skin:  Skin is warm, dry and intact. No rash noted.  No ecchymosis, erythema or abrasions seen. Psychiatric: Mood and affect are normal. Speech and behavior are  normal.  ____________________________________________   LABS (all labs ordered are listed, but only abnormal results are displayed)  Labs Reviewed - No data to display  PROCEDURES  Procedure(s) performed (including Critical Care):  Procedures   ____________________________________________   INITIAL IMPRESSION / ASSESSMENT AND PLAN / ED COURSE  As part of my medical decision making, I reviewed the following data within the electronic MEDICAL RECORD NUMBER Notes from prior ED visits and [place] Controlled Substance Database  44 year old male presents to the ED with complaint of left sided cervical pain down into his left shoulder.  He states that originally the shoulder began 2 weeks ago with development of pain the muscles in the left side of his neck 1 week ago.  He denies any injury.  Range of motion is slightly restricted laterally but patient is able to flex and extend without any difficulty.  Good muscle strength bilaterally.  Patient was given Norflex 60 mg IM and Percocet 7.5 mg p.o.  Patient was moving with decreased pain and states that is feeling much better prior to discharge.  He was made aware that a prescription for the similar medication was being sent to the pharmacy.  He is aware that he cannot drive or operate machinery while taking this medication and to also use moist heat or ice to his muscles as needed.  He is to follow-up with his PCP if any continued problems.  ____________________________________________   FINAL CLINICAL IMPRESSION(S) / ED DIAGNOSES  Final diagnoses:  Torticollis, acute     ED Discharge Orders         Ordered    methocarbamol (ROBAXIN) 500 MG tablet  Every 6 hours PRN     Discontinue  Reprint     08/20/19 1035    oxyCODONE-acetaminophen (PERCOCET) 7.5-325 MG tablet  Every 6 hours PRN     Discontinue  Reprint     08/20/19 1035           Note:  This document was prepared using Dragon voice recognition software and may include unintentional  dictation errors.    Johnn Hai, PA-C 08/20/19 1044    Lavonia Drafts, MD 08/20/19 1215

## 2019-08-20 NOTE — ED Notes (Signed)
See triage note  Presents with pain to left posterior shoulder for about 2 weeks   Then developed neck pain 1 week ago  Denies any injury

## 2019-08-20 NOTE — ED Triage Notes (Signed)
Pt c/o L shoulder pain and L sided neck pain x 1 week, pt states pain worse with movement.

## 2019-08-20 NOTE — Discharge Instructions (Addendum)
Follow-up with your primary care provider or [facility] acute care if any continued problems.  You may use ice or heat to your neck as needed for discomfort.  Continue taking the medication that was sent to your pharmacy.  The methocarbamol is 2 tablets every 6 hours for muscle spasms and Percocet every 6 hours as needed for pain.  Both these medications can cause drowsiness and increase your risk for injury.  Do not drive or operate machinery while taking this.

## 2020-10-31 ENCOUNTER — Other Ambulatory Visit: Payer: Self-pay

## 2020-10-31 ENCOUNTER — Emergency Department
Admission: EM | Admit: 2020-10-31 | Discharge: 2020-10-31 | Disposition: A | Discharge status: Home / Self Care | Payer: 59 | Attending: Emergency Medicine | Admitting: Emergency Medicine

## 2020-10-31 ENCOUNTER — Encounter: Payer: Self-pay | Admitting: Emergency Medicine

## 2020-10-31 DIAGNOSIS — F17210 Nicotine dependence, cigarettes, uncomplicated: Secondary | ICD-10-CM | POA: Insufficient documentation

## 2020-10-31 DIAGNOSIS — M545 Low back pain, unspecified: Secondary | ICD-10-CM | POA: Diagnosis not present

## 2020-10-31 MED ORDER — MELOXICAM 15 MG PO TABS: 15.0000 mg | ORAL_TABLET | Freq: Every day | ORAL | 2 refills | Status: DC

## 2020-10-31 MED ORDER — CYCLOBENZAPRINE HCL 10 MG PO TABS
10.0000 mg | ORAL_TABLET | Freq: Once | ORAL | Status: AC
  Administered 2020-10-31: 10 mg via ORAL
  Filled 2020-10-31: qty 1

## 2020-10-31 MED ORDER — BACLOFEN 10 MG PO TABS: 10.0000 mg | ORAL_TABLET | Freq: Three times a day (TID) | ORAL | 0 refills | Status: AC

## 2020-10-31 MED ORDER — KETOROLAC TROMETHAMINE 60 MG/2ML IM SOLN
60.0000 mg | Freq: Once | INTRAMUSCULAR | Status: AC
  Administered 2020-10-31: 60 mg via INTRAMUSCULAR
  Filled 2020-10-31: qty 2

## 2020-10-31 NOTE — ED Provider Notes (Signed)
[facility] Emergency Department Provider Note  ____________________________________________   Event Date/Time   First MD Initiated Contact with Patient 10/31/20 1001     (approximate)  I have reviewed the triage vital signs and the nursing notes.   HISTORY  Chief Complaint Back Pain    HPI Curtis [name] is a 45 y.o. male  C/o low back pain for 2 day, positive known injury, patient does a little lifting at work, pain is worse with movement, increased with bending over, denies numbness, tingling, or changes in bowel/urinary habits,  Using otc meds without relief Remainder ros neg   History reviewed. No pertinent past medical history.  There are no problems to display for this patient.   History reviewed. No pertinent surgical history.  Prior to Admission medications   Medication Sig Start Date End Date Taking? Authorizing Provider  baclofen (LIORESAL) 10 MG tablet Take 1 tablet (10 mg total) by mouth 3 (three) times daily for 7 days. 10/31/20 11/07/20 Yes Elchanan Bob, Linden Dolin, PA-C  meloxicam (MOBIC) 15 MG tablet Take 1 tablet (15 mg total) by mouth daily. 10/31/20 10/31/21 Yes Cheryllynn Sarff, Linden Dolin, PA-C    Allergies Patient has no known allergies.  Family History  Problem Relation Age of Onset   Diabetes Maternal Uncle    Diabetes Paternal Uncle     Social History Social History   Tobacco Use   Smoking status: Every Day    Packs/day: 1.00    Types: Cigarettes   Smokeless tobacco: Never  Substance Use Topics   Alcohol use: No    Comment: occassional some day drink   Drug use: No    Review of Systems  Constitutional: No fever/chills Eyes: No visual changes. ENT: No sore throat. Respiratory: Denies cough Genitourinary: Negative for dysuria. Musculoskeletal: Positive for back pain. Skin: Negative for rash.    ____________________________________________   PHYSICAL EXAM:  VITAL SIGNS: ED Triage Vitals  Enc Vitals Group      BP 10/31/20 0916 (!) 154/90     Pulse Rate 10/31/20 0916 84     Resp 10/31/20 0916 18     Temp 10/31/20 0916 98 F (36.7 C)     Temp Source 10/31/20 0916 Oral     SpO2 10/31/20 0916 96 %     Weight 10/31/20 0909 295 lb (133.8 kg)     Height 10/31/20 0909 6\' 1"  (1.854 m)     Head Circumference --      Peak Flow --      Pain Score 10/31/20 0909 10     Pain Loc --      Pain Edu? --      Excl. in [place]? --     Constitutional: Alert and oriented. Well appearing and in no acute distress. Eyes: Conjunctivae are normal.  Head: Atraumatic. Nose: No congestion/rhinnorhea. Mouth/Throat: Mucous membranes are moist.   Neck:  supple no lymphadenopathy noted Cardiovascular: Normal rate, regular rhythm. Heart sounds are normal Respiratory: Normal respiratory effort.  No retractions, lungs c t a  Abd: soft nontender bs normal all 4 quad GU: deferred Musculoskeletal: FROM all extremities, warm and well perfused.  Decreased rom of back due to discomfort, lumbar spine minimally tender, paravertebral muscles are spasmed, negative slr, 5/5 strength in great toes b/l, 5/5 strength in lower legs, n/v intact Neurologic:  Normal speech and language.  Skin:  Skin is warm, dry and intact. No rash noted. Psychiatric: Mood and affect are normal. Speech and behavior are normal.  ____________________________________________   LABS (all labs ordered are listed, but only abnormal results are displayed)  Labs Reviewed - No data to display ____________________________________________   ____________________________________________  RADIOLOGY    ____________________________________________   PROCEDURES  Procedure(s) performed: Toradol 60 mg IM, Flexeril 10 mg p.o.   Procedures    ____________________________________________   INITIAL IMPRESSION / ASSESSMENT AND PLAN / ED COURSE  Pertinent labs & imaging results that were available during my care of the patient were reviewed by me and considered  in my medical decision making (see chart for details).   Patient is a 45 year old male presents with low back pain.  See HPI.  Physical exam shows patient to have findings more consistent with muscle strain.  He was given Toradol 60 mg IM here in the ED along with Flexeril 10 mg p.o.  Patient had relief with medications.  He will be discharged with meloxicam and baclofen.  He is to follow-up with [facility] clinic orthopedics for evaluation of the chronic back pain.  Instructed him that he may actually need physical therapy.  He was given back exercises to do at home.  In agreement with treatment plan and discharged in stable condition with a work note.     As part of my medical decision making, I reviewed the following data within the [place] notes reviewed and incorporated, Old chart reviewed, Notes from prior ED visits, and [place] Controlled Substance Database  ____________________________________________   FINAL CLINICAL IMPRESSION(S) / ED DIAGNOSES  Final diagnoses:  Acute midline low back pain without sciatica      NEW MEDICATIONS STARTED DURING THIS VISIT:  New Prescriptions   BACLOFEN (LIORESAL) 10 MG TABLET    Take 1 tablet (10 mg total) by mouth 3 (three) times daily for 7 days.   MELOXICAM (MOBIC) 15 MG TABLET    Take 1 tablet (15 mg total) by mouth daily.     Note:  This document was prepared using Dragon voice recognition software and may include unintentional dictation errors.     Versie Starks, PA-C 10/31/20 1102    Nena Polio, MD 10/31/20 [phone]

## 2020-10-31 NOTE — ED Notes (Signed)
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

## 2020-10-31 NOTE — Discharge Instructions (Signed)
Follow-up with [facility] clinic orthopedics.  Please call for an appointment.  You may need physical therapy to strengthen your back.  Take your medications as prescribed.  Return if worsening.

## 2020-10-31 NOTE — ED Triage Notes (Signed)
Patient to ER for c/o lower back pain bilaterally. Patient has long standing history of the same. Denies any injury.

## 2020-11-04 ENCOUNTER — Other Ambulatory Visit: Payer: Self-pay

## 2020-11-04 ENCOUNTER — Emergency Department: Payer: 59

## 2020-11-04 ENCOUNTER — Emergency Department
Admission: EM | Admit: 2020-11-04 | Discharge: 2020-11-04 | Disposition: A | Discharge status: Home / Self Care | Payer: 59 | Attending: Emergency Medicine | Admitting: Emergency Medicine

## 2020-11-04 DIAGNOSIS — M549 Dorsalgia, unspecified: Secondary | ICD-10-CM | POA: Diagnosis present

## 2020-11-04 DIAGNOSIS — M5459 Other low back pain: Secondary | ICD-10-CM | POA: Diagnosis not present

## 2020-11-04 DIAGNOSIS — M545 Low back pain, unspecified: Secondary | ICD-10-CM

## 2020-11-04 DIAGNOSIS — F17210 Nicotine dependence, cigarettes, uncomplicated: Secondary | ICD-10-CM | POA: Insufficient documentation

## 2020-11-04 MED ORDER — CYCLOBENZAPRINE HCL 10 MG PO TABS
10.0000 mg | ORAL_TABLET | Freq: Once | ORAL | Status: AC
  Administered 2020-11-04: 10 mg via ORAL
  Filled 2020-11-04: qty 1

## 2020-11-04 MED ORDER — KETOROLAC TROMETHAMINE 60 MG/2ML IM SOLN
30.0000 mg | Freq: Once | INTRAMUSCULAR | Status: AC
  Administered 2020-11-04: 30 mg via INTRAMUSCULAR
  Filled 2020-11-04: qty 2

## 2020-11-04 MED ORDER — CYCLOBENZAPRINE HCL 10 MG PO TABS: 10.0000 mg | ORAL_TABLET | Freq: Three times a day (TID) | ORAL | 0 refills | Status: DC | PRN

## 2020-11-04 MED ORDER — TRAMADOL HCL 50 MG PO TABS: 50.0000 mg | ORAL_TABLET | Freq: Four times a day (QID) | ORAL | 0 refills | Status: DC | PRN

## 2020-11-04 NOTE — ED Triage Notes (Addendum)
Pt comes pov with lower bilateral back pain radiating into his legs after a fall Thursday in his tub. Was seen here Tuesday for back pain before fall and tried meds that he was given with no relief.

## 2020-11-04 NOTE — Discharge Instructions (Signed)
Tramadol and flexeril may cause drowsiness. If you are taking either of these, do not drive.  Return to the ER for symptoms that change or worsen if unable to see primary care.

## 2020-11-04 NOTE — ED Provider Notes (Signed)
Emergency Medicine Provider Triage Evaluation Note  Curtis [name] , a 45 y.o. male  was evaluated in triage.  Pt complains of low back pain. He fell in the bathtub 2 days ago. His back had already been hurting and he was evaluated here a few days prior to this fall. No relief with medications prescribed at last visit..  Review of Systems  Positive: Back Pain Negative: Bowel/bladder changes  Physical Exam  BP (!) 147/118   Pulse (!) 113   Temp 98.3 F (36.8 C) (Oral)   Resp 18   Ht 6\' 1"  (1.854 m)   Wt 133 kg   SpO2 100%   BMI 38.68 kg/m  Gen:   Awake, no distress   Resp:  Normal effort  MSK:   Moves extremities without difficulty  Other:    Medical Decision Making  Medically screening exam initiated at 2:30 PM.  Appropriate orders placed.  [name] was informed that the remainder of the evaluation will be completed by another provider, this initial triage assessment does not replace that evaluation, and the importance of remaining in the ED until their evaluation is complete.   Victorino Dike, FNP 11/04/20 1432    Naaman Plummer, MD 11/04/20 [phone]

## 2020-11-04 NOTE — ED Provider Notes (Signed)
[facility] Emergency Department Provider Note ____________________________________________  Time seen: Approximately 3:15 PM  I have reviewed the triage vital signs and the nursing notes.  HISTORY  Chief Complaint Back Pain   HPI EPIC TRIBBETT is a 45 y.o. male presents to the emergency department for treatment and evaluation of low back pain. He was evaluated for the same 4 days ago and was getting better with prescribed medication until he fell in the bathtub 2 days ago. Pain is in the same location but worse and not improving with medication.  History reviewed. No pertinent past medical history.  There are no problems to display for this patient.   History reviewed. No pertinent surgical history.  Prior to Admission medications   Medication Sig Start Date End Date Taking? Authorizing Provider  cyclobenzaprine (FLEXERIL) 10 MG tablet Take 1 tablet (10 mg total) by mouth 3 (three) times daily as needed. 11/04/20  Yes Penny Frisbie B, FNP  traMADol (ULTRAM) 50 MG tablet Take 1 tablet (50 mg total) by mouth every 6 (six) hours as needed. 11/04/20  Yes Neenah Canter B, FNP  baclofen (LIORESAL) 10 MG tablet Take 1 tablet (10 mg total) by mouth 3 (three) times daily for 7 days. 10/31/20 11/07/20  Fisher, Linden Dolin, PA-C  meloxicam (MOBIC) 15 MG tablet Take 1 tablet (15 mg total) by mouth daily. 10/31/20 10/31/21  Versie Starks, PA-C    Allergies Patient has no known allergies.  Family History  Problem Relation Age of Onset   Diabetes Maternal Uncle    Diabetes Paternal Uncle     Social History Social History   Tobacco Use   Smoking status: Every Day    Packs/day: 1.00    Types: Cigarettes   Smokeless tobacco: Never  Substance Use Topics   Alcohol use: No    Comment: occassional some day drink   Drug use: No    Review of Systems Constitutional: Well appearing. Respiratory: Negative for dyspnea. Cardiovascular: Negative for change in  skin temperature or color. Musculoskeletal:   Negative for chronic steroid use   Negative for trauma in the presence of osteoporosis  Negative for age over 40 and trauma.  Negative for constitutional symptoms, or history of cancer   Negative for pain worse at night. Skin: Negative for rash, lesion, or wound.  Genitourinary: Negative for urinary retention. Rectal: Negative for fecal incontinence or new onset constipation/bowel habit changes. Hematological/Immunilogical: Negative for immunosuppression, IV drug use, or fever Neurological: Negative for burning, tingling, numb, electric, radiating pain in the lower extremities.                        Negative for saddle anesthesia.                        Negative for focal neurologic deficit, progressive or disabling symptoms             Negative for saddle anesthesia. ____________________________________________   PHYSICAL EXAM:  VITAL SIGNS: ED Triage Vitals  Enc Vitals Group     BP 11/04/20 1427 (!) 147/118     Pulse Rate 11/04/20 1427 (!) 113     Resp 11/04/20 1427 18     Temp 11/04/20 1427 98.3 F (36.8 C)     Temp Source 11/04/20 1427 Oral     SpO2 11/04/20 1427 100 %     Weight 11/04/20 1428 293 lb 3.4 oz (133 kg)  Height 11/04/20 1428 6\' 1"  (1.854 m)     Head Circumference --      Peak Flow --      Pain Score 11/04/20 1427 10     Pain Loc --      Pain Edu? --      Excl. in [place]? --     Constitutional: Alert and oriented. Well appearing and in no acute distress. Eyes: Conjunctivae are clear without discharge or drainage.  Head: Atraumatic. Neck: Full, active range of motion. Respiratory: Respirations even and unlabored. Musculoskeletal: Full ROM of the back and extremities, Strength 5/5 of the lower extremities as tested. Neurologic: Reflexes of the lower extremities are 2+. Negative straight leg raise on the right and left side. Skin: Atraumatic.  Psychiatric: Behavior and affect are  normal.  ____________________________________________   LABS (all labs ordered are listed, but only abnormal results are displayed)  Labs Reviewed - No data to display ____________________________________________  RADIOLOGY  Image of the lumbar spine is negative for acute concerns. ____________________________________________   PROCEDURES  Procedure(s) performed:  Procedures ____________________________________________   INITIAL IMPRESSION / ASSESSMENT AND PLAN / ED COURSE  KAREE CHRISTOPHERSON is a 45 y.o. male presenting to the emergency department for treatment and evaluation of low back pain after a mechanical, nonsyncopal fall.  See HPI for further details.  X-ray of the lumbar spine is negative for acute concerns.  Patient given Toradol and will be prescribed tramadol and Flexeril.  He was encouraged to follow-up with primary care if symptoms or not improving over the week.  He is encouraged to return to the emergency department for symptoms of change or worsen if he is unable to schedule appointment.  Medications  ketorolac (TORADOL) injection 30 mg (30 mg Intramuscular Given 11/04/20 1534)  cyclobenzaprine (FLEXERIL) tablet 10 mg (10 mg Oral Given 11/04/20 1534)    ED Discharge Orders          Ordered    traMADol (ULTRAM) 50 MG tablet  Every 6 hours PRN        11/04/20 1526    cyclobenzaprine (FLEXERIL) 10 MG tablet  3 times daily PRN        11/04/20 1526               Pertinent labs & imaging results that were available during my care of the patient were reviewed by me and considered in my medical decision making (see chart for details).   _________________________________________   FINAL CLINICAL IMPRESSION(S) / ED DIAGNOSES   Final diagnoses:  Acute right-sided low back pain without sciatica     If controlled substance prescribed during this visit, 12 month history viewed on the [place] prior to issuing an initial prescription for Schedule II or  III opiod.    Victorino Dike, FNP 11/04/20 1847    Naaman Plummer, MD 11/05/20 [phone]

## 2021-09-04 NOTE — Progress Notes (Unsigned)
   I,Curtis [name],acting as a Education administrator for [employer], PA-C.,have documented all relevant documentation on the behalf of Curtis Speak, PA-C,as directed by  [employer], PA-C while in the presence of [employer], PA-C.  New Patient Office Visit  Subjective    Patient ID: Curtis [name], male    DOB: [birth year]  Age: 46 y.o. MRN: [id number]  CC: No chief complaint on file.   HPI [name] presents to establish care ***  Outpatient Encounter Medications as of 09/05/2021  Medication Sig   cyclobenzaprine (FLEXERIL) 10 MG tablet Take 1 tablet (10 mg total) by mouth 3 (three) times daily as needed.   meloxicam (MOBIC) 15 MG tablet Take 1 tablet (15 mg total) by mouth daily.   traMADol (ULTRAM) 50 MG tablet Take 1 tablet (50 mg total) by mouth every 6 (six) hours as needed.   No facility-administered encounter medications on file as of 09/05/2021.    No past medical history on file.  No past surgical history on file.  Family History  Problem Relation Age of Onset   Diabetes Maternal Uncle    Diabetes Paternal Uncle     Social History   Socioeconomic History   Marital status: Married    Spouse name: Not on file   Number of children: Not on file   Years of education: Not on file   Highest education level: Not on file  Occupational History   Not on file  Tobacco Use   Smoking status: Every Day    Packs/day: 1.00    Types: Cigarettes   Smokeless tobacco: Never  Substance and Sexual Activity   Alcohol use: No    Comment: occassional some day drink   Drug use: No   Sexual activity: Not on file  Other Topics Concern   Not on file  Social History Narrative   Not on file   Social Determinants of Health   Financial Resource Strain: Not on file  Food Insecurity: Not on file  Transportation Needs: Not on file  Physical Activity: Not on file  Stress: Not on file  Social Connections: Not on file  Intimate Partner Violence: Not on file    ROS       Objective    There were no vitals taken for this visit.  Physical Exam  {Labs (Optional):23779}    Assessment & Plan:   Problem List Items Addressed This Visit   None   No follow-ups on file.   [name], CMA

## 2021-09-05 ENCOUNTER — Ambulatory Visit (INDEPENDENT_AMBULATORY_CARE_PROVIDER_SITE_OTHER): Payer: 59 | Admitting: Physician Assistant

## 2021-09-05 ENCOUNTER — Encounter: Payer: Self-pay | Admitting: Physician Assistant

## 2021-09-05 VITALS — BP 144/108 | HR 80 | Temp 98.8°F | Resp 16 | Ht 73.0 in | Wt 311.2 lb

## 2021-09-05 DIAGNOSIS — I499 Cardiac arrhythmia, unspecified: Secondary | ICD-10-CM

## 2021-09-05 DIAGNOSIS — R609 Edema, unspecified: Secondary | ICD-10-CM

## 2021-09-05 DIAGNOSIS — Z0000 Encounter for general adult medical examination without abnormal findings: Secondary | ICD-10-CM

## 2021-09-05 DIAGNOSIS — E1159 Type 2 diabetes mellitus with other circulatory complications: Secondary | ICD-10-CM

## 2021-09-05 DIAGNOSIS — R0600 Dyspnea, unspecified: Secondary | ICD-10-CM | POA: Diagnosis not present

## 2021-09-05 DIAGNOSIS — M7989 Other specified soft tissue disorders: Secondary | ICD-10-CM

## 2021-09-05 DIAGNOSIS — I152 Hypertension secondary to endocrine disorders: Secondary | ICD-10-CM

## 2021-09-05 DIAGNOSIS — R000 Tachycardia, unspecified: Secondary | ICD-10-CM | POA: Diagnosis not present

## 2021-09-05 MED ORDER — LOSARTAN POTASSIUM-HCTZ 50-12.5 MG PO TABS: 1.0000 | ORAL_TABLET | Freq: Every day | ORAL | 0 refills | Status: DC

## 2021-09-06 LAB — COMPREHENSIVE METABOLIC PANEL
ALT: 23 IU/L (ref 0–44)
AST: 20 IU/L (ref 0–40)
Albumin/Globulin Ratio: 1.5 (ref 1.2–2.2)
Albumin: 4.1 g/dL (ref 4.1–5.1)
Alkaline Phosphatase: 94 IU/L (ref 44–121)
BUN/Creatinine Ratio: 18 (ref 9–20)
BUN: 17 mg/dL (ref 6–24)
Bilirubin Total: 0.2 mg/dL (ref 0.0–1.2)
CO2: 24 mmol/L (ref 20–29)
Calcium: 9.3 mg/dL (ref 8.7–10.2)
Chloride: 103 mmol/L (ref 96–106)
Creatinine, Ser: 0.93 mg/dL (ref 0.76–1.27)
Globulin, Total: 2.7 g/dL (ref 1.5–4.5)
Glucose: 105 mg/dL — ABNORMAL HIGH (ref 70–99)
Potassium: 4.6 mmol/L (ref 3.5–5.2)
Sodium: 142 mmol/L (ref 134–144)
Total Protein: 6.8 g/dL (ref 6.0–8.5)
eGFR: 103 mL/min/{1.73_m2} (ref >59)

## 2021-09-06 LAB — TSH+FREE T4
Free T4: 1.25 ng/dL (ref 0.82–1.77)
TSH: 1.44 u[IU]/mL (ref 0.450–4.500)

## 2021-09-06 LAB — CBC WITH DIFFERENTIAL/PLATELET
Basophils Absolute: 0.1 10*3/uL (ref 0.0–0.2)
Basos: 0 %
EOS (ABSOLUTE): 0.3 10*3/uL (ref 0.0–0.4)
Eos: 2 %
Hematocrit: 45.4 % (ref 37.5–51.0)
Hemoglobin: 14.5 g/dL (ref 13.0–17.7)
Immature Grans (Abs): 0.1 10*3/uL (ref 0.0–0.1)
Immature Granulocytes: 1 %
Lymphocytes Absolute: 2.5 10*3/uL (ref 0.7–3.1)
Lymphs: 19 %
MCH: 26.8 pg (ref 26.6–33.0)
MCHC: 31.9 g/dL (ref 31.5–35.7)
MCV: 84 fL (ref 79–97)
Monocytes Absolute: 1.1 10*3/uL — ABNORMAL HIGH (ref 0.1–0.9)
Monocytes: 8 %
Neutrophils Absolute: 9.4 10*3/uL — ABNORMAL HIGH (ref 1.4–7.0)
Neutrophils: 70 %
Platelets: 348 10*3/uL (ref 150–450)
RBC: 5.42 x10E6/uL (ref 4.14–5.80)
RDW: 15.6 % — ABNORMAL HIGH (ref 11.6–15.4)
WBC: 13.4 10*3/uL — ABNORMAL HIGH (ref 3.4–10.8)

## 2021-09-06 LAB — PRO B NATRIURETIC PEPTIDE: NT-Pro BNP: <36 pg/mL (ref 0–121)

## 2021-09-13 ENCOUNTER — Ambulatory Visit: Payer: Self-pay | Admitting: Physician Assistant

## 2021-09-13 NOTE — Progress Notes (Deleted)
    Argentina Ponder Jerauld Bostwick,acting as a Education administrator for [employer], PA-C.,have documented all relevant documentation on the behalf of [name], PA-C,as directed by  [employer], PA-C while in the presence of [employer], PA-C.    Established patient visit   Patient: Curtis [name]   DOB: [birth year]   46 y.o. Male  MRN: [id number] Visit Date: 09/13/2021  Today's healthcare provider: [name], PA-C   No chief complaint on file.  Subjective    HPI  Patient is a 46 year old male who presents today for follow up of chronic cardiac conditions and smoking cessation.  He was last seen on 09/05/21.  At that time, labs were ordered and Losartan HCTZ was started.   Medications: Outpatient Medications Prior to Visit  Medication Sig   losartan-hydrochlorothiazide (HYZAAR) 50-12.5 MG tablet Take 1 tablet by mouth daily.   No facility-administered medications prior to visit.    Review of Systems  {Labs  Heme  Chem  Endocrine  Serology  Results Review (optional):23779}   Objective    There were no vitals taken for this visit. {Show previous vital signs (optional):23777}  Physical Exam  ***  No results found for any visits on 09/13/21.  Assessment & Plan     ***  No follow-ups on file.      {provider attestation***:1}   [name], Hershal Coria  [facility] [phone] (phone) [phone] (fax)  [place]

## 2021-09-19 ENCOUNTER — Ambulatory Visit: Payer: 59 | Admitting: Physician Assistant

## 2021-09-19 ENCOUNTER — Encounter: Payer: Self-pay | Admitting: Physician Assistant

## 2021-09-19 DIAGNOSIS — I10 Essential (primary) hypertension: Secondary | ICD-10-CM | POA: Diagnosis not present

## 2021-09-19 DIAGNOSIS — R0600 Dyspnea, unspecified: Secondary | ICD-10-CM | POA: Diagnosis not present

## 2021-09-19 DIAGNOSIS — M7989 Other specified soft tissue disorders: Secondary | ICD-10-CM | POA: Diagnosis not present

## 2021-09-19 DIAGNOSIS — I499 Cardiac arrhythmia, unspecified: Secondary | ICD-10-CM

## 2021-09-19 NOTE — Progress Notes (Signed)
I,Jermery Caratachea Robinson,acting as a Education administrator for [employer], PA-C.,have documented all relevant documentation on the behalf of [name], PA-C,as directed by  [employer], PA-C while in the presence of [employer], PA-C.    Established patient visit   Patient: Curtis [name]   DOB: 05/27/1975   46 y.o. Male  MRN: [id number] Visit Date: 09/19/2021  Today's healthcare provider: [name], PA-C  CC: HTN FU   Subjective    Hypertension, follow-up  BP Readings from Last 3 Encounters:  09/05/21 (!) 144/108  11/04/20 (!) 145/75  10/31/20 (!) 154/90   Wt Readings from Last 3 Encounters:  09/05/21 (!) 311 lb 3.2 oz (141.2 kg)  11/04/20 293 lb 3.4 oz (133 kg)  10/31/20 295 lb (133.8 kg)     He was last seen for hypertension 2 weeks ago.  BP at that visit was 144/108. Management since that visit includes - losartan-hydrochlorothiazide 50-12.5 MG tablet daily. He reports excellent compliance with treatment. He is not having side effects.  He is following a Regular diet. He is not exercising. He does smoke.  Use of agents associated with hypertension: none.   Outside blood pressures are Symptoms: No chest pain No chest pressure  No palpitations No syncope  No dyspnea No orthopnea  No paroxysmal nocturnal dyspnea No lower extremity edema   Pertinent labs No results found for: "CHOL", "HDL", "LDLCALC", "LDLDIRECT", "TRIG", "CHOLHDL" Lab Results  Component Value Date   NA 142 09/05/2021   K 4.6 09/05/2021   CREATININE 0.93 09/05/2021   EGFR 103 09/05/2021   GLUCOSE 105 (H) 09/05/2021   TSH 1.440 09/05/2021     The ASCVD Risk score (Arnett DK, et al., 2019) failed to calculate for the following reasons:   Cannot find a previous HDL lab   Cannot find a previous total cholesterol lab  --------------------------------------------------------------------------------------------------- Reports he still gets lightheaded with exertion.  Patient did not hear from lab  results from previous visit.  Requesting Rx to quit smoking.    Medications: Outpatient Medications Prior to Visit  Medication Sig   losartan-hydrochlorothiazide (HYZAAR) 50-12.5 MG tablet Take 1 tablet by mouth daily.   No facility-administered medications prior to visit.    Review of Systems  All other systems reviewed and are negative.  Except see HPI     Objective    There were no vitals taken for this visit.  Vitals:   09/19/21 1445 09/19/21 1451  BP: (!) 144/90 (!) 152/88  Pulse: 88   Resp: 16   Temp: 97.8 F (36.6 C)   SpO2: 98%     Physical Exam Vitals reviewed.  Constitutional:      General: He is not in acute distress.    Appearance: Normal appearance. He is not diaphoretic.  HENT:     Head: Normocephalic and atraumatic.     Nose: Nose normal.  Eyes:     General: No scleral icterus.       Right eye: No discharge.        Left eye: No discharge.     Extraocular Movements: Extraocular movements intact.     Conjunctiva/sclera: Conjunctivae normal.     Pupils: Pupils are equal, round, and reactive to light.  Cardiovascular:     Rate and Rhythm: Normal rate and regular rhythm.     Pulses: Normal pulses.     Heart sounds: Normal heart sounds. No murmur heard. Pulmonary:     Effort: Pulmonary effort is normal. No respiratory distress.  Breath sounds: Normal breath sounds. No stridor. No wheezing, rhonchi or rales.  Chest:     Chest wall: No tenderness.  Abdominal:     General: Bowel sounds are normal.  Musculoskeletal:        General: Normal range of motion.     Cervical back: Normal range of motion and neck supple.     Right lower leg: No edema.     Left lower leg: No edema.  Lymphadenopathy:     Cervical: No cervical adenopathy.  Skin:    General: Skin is warm and dry.     Capillary Refill: Capillary refill takes less than 2 seconds.     Findings: No rash.  Neurological:     General: No focal deficit present.     Mental Status: He is alert  and oriented to person, place, and time. Mental status is at baseline.  Psychiatric:        Behavior: Behavior normal.        Thought Content: Thought content normal.        Judgment: Judgment normal.      No results found for any visits on 09/19/21.  Assessment & Plan     There are no diagnoses linked to this encounter. All lab results were explained.  1. Morbid obesity ([place]) BMI 40.40 Weight loss of 5% of pt's current weight via healthy diet and daily exercise encouraged.  2. Hypertension associated with diabetes ([place]) BP today was 152/88 first reading, 144/90 second reading Pt should continue to take entire tablet of BP medication Discussed low salt diet. Pat education and instructions for diet were provided. Encouraged to improve his dietary habits and to start daily exercise Continue with Hyzaar (1 tab)  3. Dyspnea, unspecified type Chronic and stable BNP results were WNL Weight loss advised Pt did not have CXR/scheduled in August of 2023  4. Leg swelling Chronic and stable Im?proved Continue Hyzaar Weight loss advised  5. Irregular heart rhythm Chronic and stable Today his HR was 88 Might consider Zio monitor/EKG  6. Will discuss smoking cessation at the next visit 7. Leukocytosis similar to 3 years ago when pt had acute arm pain with cervical radiculopathy Pt states that he might have neck pain today? Will repeat CBC at his next visit  CPE in 2 weeks and FU for BP/PSA and screenings, Gaps/recheck CBC! Lipids, leukocytosis     The patient was advised to call back or seek an in-person evaluation if the symptoms worsen or if the condition fails to improve as anticipated.  I discussed the assessment and treatment plan with the patient. The patient was provided an opportunity to ask questions and all were answered. The patient agreed with the plan and demonstrated an understanding of the instructions.  The entirety of the information documented in the History  of Present Illness, Review of Systems and Physical Exam were personally obtained by me. Portions of this information were initially documented by the CMA and reviewed by me for thoroughness and accuracy.  Portions of this note were created using dictation software and may contain typographical errors.        Total encounter time more than 30 minutes  Greater than 50% was spent in counseling and coordination of care with the patient   Elberta Leatherwood  [facility] [phone] (phone) [phone] (fax)  [place]

## 2021-10-03 ENCOUNTER — Encounter: Payer: Self-pay | Admitting: Physician Assistant

## 2021-10-03 ENCOUNTER — Telehealth: Payer: Self-pay | Admitting: *Deleted

## 2021-10-03 ENCOUNTER — Ambulatory Visit (INDEPENDENT_AMBULATORY_CARE_PROVIDER_SITE_OTHER): Payer: 59 | Admitting: Physician Assistant

## 2021-10-03 ENCOUNTER — Other Ambulatory Visit: Payer: Self-pay | Admitting: *Deleted

## 2021-10-03 VITALS — BP 157/96 | HR 83 | Temp 98.2°F | Resp 18 | Ht 73.0 in | Wt 310.0 lb

## 2021-10-03 DIAGNOSIS — F17200 Nicotine dependence, unspecified, uncomplicated: Secondary | ICD-10-CM

## 2021-10-03 DIAGNOSIS — Z1211 Encounter for screening for malignant neoplasm of colon: Secondary | ICD-10-CM

## 2021-10-03 DIAGNOSIS — Z0000 Encounter for general adult medical examination without abnormal findings: Secondary | ICD-10-CM

## 2021-10-03 DIAGNOSIS — I10 Essential (primary) hypertension: Secondary | ICD-10-CM | POA: Diagnosis not present

## 2021-10-03 DIAGNOSIS — R7989 Other specified abnormal findings of blood chemistry: Secondary | ICD-10-CM

## 2021-10-03 DIAGNOSIS — Z136 Encounter for screening for cardiovascular disorders: Secondary | ICD-10-CM

## 2021-10-03 DIAGNOSIS — R0600 Dyspnea, unspecified: Secondary | ICD-10-CM | POA: Diagnosis not present

## 2021-10-03 DIAGNOSIS — I499 Cardiac arrhythmia, unspecified: Secondary | ICD-10-CM

## 2021-10-03 DIAGNOSIS — IMO0001 Reserved for inherently not codable concepts without codable children: Secondary | ICD-10-CM

## 2021-10-03 DIAGNOSIS — Z114 Encounter for screening for human immunodeficiency virus [HIV]: Secondary | ICD-10-CM

## 2021-10-03 DIAGNOSIS — Z1159 Encounter for screening for other viral diseases: Secondary | ICD-10-CM

## 2021-10-03 DIAGNOSIS — R609 Edema, unspecified: Secondary | ICD-10-CM

## 2021-10-03 MED ORDER — VARENICLINE TARTRATE (STARTER) 0.5 MG X 11 & 1 MG X 42 PO TBPK: ORAL_TABLET | ORAL | 0 refills | Status: DC

## 2021-10-03 MED ORDER — NA SULFATE-K SULFATE-MG SULF 17.5-3.13-1.6 GM/177ML PO SOLN: 1.0000 | Freq: Once | ORAL | 0 refills | Status: AC

## 2021-10-03 NOTE — Progress Notes (Signed)
I,Amarri Michaelson L Iveliz Garay,acting as a Neurosurgeon for [employer], PA-C.,have documented all relevant documentation on the behalf of Debera Lat, PA-C,as directed by  [employer], PA-C while in the presence of [employer], PA-C.   Complete physical exam   Patient: Curtis [name]   DOB: [birth year]   46 y.o. Male  MRN: [id number] Visit Date: 10/03/2021  Today's healthcare provider: Debera Lat, PA-C   Chief Complaint  Patient presents with   Annual Exam   Subjective    Curtis [name] is a 46 y.o. male who presents today for a complete physical exam.  He reports consuming a general diet. The patient does not participate in regular exercise at present. He generally feels fairly well. He reports sleeping fairly well. He does not have additional problems to discuss today.  HPI  Hypertension, follow-up  BP Readings from Last 3 Encounters:  10/03/21 (!) 157/96  09/19/21 (!) 152/88  09/05/21 (!) 144/108   Wt Readings from Last 3 Encounters:  10/03/21 (!) 310 lb (140.6 kg)  09/19/21 (!) 306 lb 3.2 oz (138.9 kg)  09/05/21 (!) 311 lb 3.2 oz (141.2 kg)     He was last seen for hypertension on 09/19/2021.   BP at that visit was 152/88. Management since that visit includes discussing low salt diet. Patient education and instructions for diet were provided. Encouraged to improve his dietary habits and to start daily exercise Continue with Hyzaar .  He reports good compliance with treatment. He is not having side effects.  He is following a Regular diet. He is not exercising. He does smoke.  Use of agents associated with hypertension: none.   Outside blood pressures are not checked. Symptoms: No chest pain No chest pressure  No palpitations No syncope  No dyspnea No orthopnea  No paroxysmal nocturnal dyspnea No lower extremity edema   Pertinent labs No results found for: "CHOL", "HDL", "LDLCALC", "LDLDIRECT", "TRIG", "CHOLHDL" Lab Results  Component Value Date   NA 142  09/05/2021   K 4.6 09/05/2021   CREATININE 0.93 09/05/2021   EGFR 103 09/05/2021   GLUCOSE 105 (H) 09/05/2021   TSH 1.440 09/05/2021     The ASCVD Risk score (Arnett DK, et al., 2019) failed to calculate for the following reasons:   Cannot find a previous HDL lab   Cannot find a previous total cholesterol lab  ---------------------------------------------------------------------------------------------------   Past Medical History:  Diagnosis Date   Allergies    No past surgical history on file. Social History   Socioeconomic History   Marital status: Married    Spouse name: Not on file   Number of children: Not on file   Years of education: Not on file   Highest education level: Not on file  Occupational History   Not on file  Tobacco Use   Smoking status: Every Day    Packs/day: 1.50    Types: Cigarettes   Smokeless tobacco: Never  Vaping Use   Vaping Use: Not on file  Substance and Sexual Activity   Alcohol use: Yes    Alcohol/week: 10.0 standard drinks of alcohol    Types: 5 Cans of beer, 5 Shots of liquor per week   Drug use: No   Sexual activity: Not on file  Other Topics Concern   Not on file  Social History Narrative   Not on file   Social Determinants of Health   Financial Resource Strain: Not on file  Food Insecurity: Not on file  Transportation Needs:  Not on file  Physical Activity: Not on file  Stress: Not on file  Social Connections: Not on file  Intimate Partner Violence: Not on file   Family Status  Relation Name Status   Mat Uncle  (Not Specified)   Annamarie Major  (Not Specified)   Family History  Problem Relation Age of Onset   Diabetes Maternal Uncle    Diabetes Paternal Uncle    No Known Allergies  Patient Care Team: [name], PA-C as PCP - General (Physician Assistant)   Medications: Outpatient Medications Prior to Visit  Medication Sig   losartan-hydrochlorothiazide (HYZAAR) 50-12.5 MG tablet Take 1 tablet by mouth  daily.   No facility-administered medications prior to visit.    Review of Systems  Constitutional:  Negative for appetite change, chills, fatigue and fever.  HENT:  Negative for congestion, ear pain, hearing loss, nosebleeds and trouble swallowing.   Eyes:  Negative for pain and visual disturbance.  Respiratory:  Negative for cough, chest tightness, shortness of breath and wheezing.   Cardiovascular:  Negative for chest pain, palpitations and leg swelling.  Gastrointestinal:  Negative for abdominal pain, blood in stool, constipation, diarrhea, nausea and vomiting.  Endocrine: Negative for polydipsia, polyphagia and polyuria.  Genitourinary:  Negative for dysuria and flank pain.  Musculoskeletal:  Negative for arthralgias, back pain, joint swelling, myalgias and neck stiffness.  Skin:  Negative for color change, rash and wound.  Neurological:  Negative for dizziness, tremors, seizures, speech difficulty, weakness, light-headedness and headaches.  Psychiatric/Behavioral:  Negative for behavioral problems, confusion, decreased concentration, dysphoric mood and sleep disturbance. The patient is not nervous/anxious.   All other systems reviewed and are negative.     Objective     BP (!) 157/96 (BP Location: Left Arm, Patient Position: Sitting, Cuff Size: Large)   Pulse 83   Temp 98.2 F (36.8 C) (Oral)   Resp 18   Ht $R'6\' 1"'Zm$  (1.854 m)   Wt (!) 310 lb (140.6 kg)   SpO2 98% Comment: room air  BMI 40.90 kg/m     Today's Vitals   10/03/21 1000 10/03/21 1007  BP: (!) 168/94 (!) 157/96  Pulse: 92 83  Resp: 18   Temp: 98.2 F (36.8 C)   TempSrc: Oral   SpO2: 98%   Weight: (!) 310 lb (140.6 kg)   Height: $Remove'6\' 1"'uTPldYK$  (1.854 m)    Body mass index is 40.9 kg/m.   Physical Exam Vitals reviewed.  Constitutional:      General: He is not in acute distress.    Appearance: Normal appearance. He is well-developed. He is not diaphoretic.  HENT:     Head: Normocephalic and atraumatic.      Right Ear: Tympanic membrane, ear canal and external ear normal.     Left Ear: Tympanic membrane, ear canal and external ear normal.     Nose: Nose normal.     Mouth/Throat:     Mouth: Mucous membranes are moist.     Pharynx: Oropharynx is clear. No oropharyngeal exudate.  Eyes:     General: No scleral icterus.       Right eye: No discharge.     Conjunctiva/sclera: Conjunctivae normal.     Pupils: Pupils are equal, round, and reactive to light.  Neck:     Thyroid: No thyromegaly.  Cardiovascular:     Rate and Rhythm: Normal rate and regular rhythm.     Pulses: Normal pulses.     Heart sounds: Normal heart sounds. No murmur  heard. Pulmonary:     Effort: Pulmonary effort is normal. No respiratory distress.     Breath sounds: Normal breath sounds. No wheezing or rales.  Abdominal:     General: Abdomen is flat. Bowel sounds are normal. There is no distension.     Palpations: Abdomen is soft.     Tenderness: There is no abdominal tenderness.  Musculoskeletal:        General: No deformity. Normal range of motion.     Cervical back: Normal range of motion and neck supple.     Right lower leg: Edema present.     Left lower leg: Edema present.  Lymphadenopathy:     Cervical: No cervical adenopathy.  Skin:    General: Skin is warm and dry.     Findings: No rash.  Neurological:     Mental Status: He is alert and oriented to person, place, and time. Mental status is at baseline.     Sensory: No sensory deficit.     Motor: No weakness.     Gait: Gait normal.  Psychiatric:        Mood and Affect: Mood normal.        Behavior: Behavior normal.        Thought Content: Thought content normal.        Judgment: Judgment normal.      Last depression screening scores    10/03/2021   10:11 AM 09/05/2021    8:51 AM  PHQ 2/9 Scores  PHQ - 2 Score 0 0  PHQ- 9 Score 0 0   Last fall risk screening    09/05/2021    8:51 AM  [place] in the past year? 0  [place] falls in past  yr: 0  Injury with Fall? 0  Follow up Falls evaluation completed   Last Audit-C alcohol use screening    09/05/2021    8:52 AM  Alcohol Use Disorder Test (AUDIT)  1. How often do you have a drink containing alcohol? 3  2. How many drinks containing alcohol do you have on a typical day when you are drinking? 2  3. How often do you have six or more drinks on one occasion? 3  AUDIT-C Score 8   A score of 3 or more in women, and 4 or more in men indicates increased risk for alcohol abuse, EXCEPT if all of the points are from question 1   No results found for any visits on 10/03/21.  Assessment & Plan    Routine Health Maintenance and Physical Exam  Exercise Activities and Dietary recommendations  Goals   Weight loss of 5% of his current weight via healthy diet and daily exercise      There is no immunization history on file for this patient.  Health Maintenance  Topic Date Due   COVID-19 Vaccine (1) Never done   HIV Screening  Never done   Diabetic kidney evaluation - Urine ACR  Never done   Hepatitis C Screening  Never done   TETANUS/TDAP  Never done   COLONOSCOPY (Pts 45-42yrs Insurance coverage will need to be confirmed)  Never done   INFLUENZA VACCINE  04/14/2022 (Originally 08/14/2021)   Diabetic kidney evaluation - GFR measurement  09/06/2022   HPV VACCINES  Aged Out    Discussed health benefits of physical activity, and encouraged him to engage in regular exercise appropriate for his age and condition.  Annual physical exam  UTD on dental/eye Things to do  to keep yourself healthy  - Exercise at least 30-45 minutes a day, 3-4 days a week.  - Eat a low-fat diet with lots of fruits and vegetables, up to 7-9 servings per day.  - Seatbelts can save your life. Wear them always.  - Smoke detectors on every level of your home, check batteries every year.  - Eye Doctor - have an eye exam every 1-2 years  - Safe sex - if you may be exposed to STDs, use a condom.  -  Alcohol -  If you drink, do it moderately, less than 2 drinks per day.  - [place]. Choose someone to speak for you if you are not able.  - Depression is common in our stressful world.If you're feeling down or losing interest in things you normally enjoy, please come in for a visit.  - Violence - If anyone is threatening or hurting you, please call immediately.   Morbid obesity ([place]) BMI 40.90  Weight loss of 5% of pt's current weight via healthy diet and daily exercise encouraged.   Hypertension, unspecified type BP 157/96 second reading  Continue BP log! Increased BP /Hyzaardose to 2 tab daily Needs to be seen for BP check in 1 week EKG form 9/6 showed normal sinus rhythm - CBC with Differential/Platelet The patient was advised to call back or seek an in-person evaluation if the symptoms worsen or if the condition fails to improve as anticipated.  Dyspnea, unspecified type Chronic, stable Pt declined CXR Weight loss advised, bnp wnl - CBC with Differential/Platelet  2+ pitting edema Improved on BP med HCTZ dose was  increased to $RemoveBefo'25mg'xRHilMUcQYC$    Smoking Willing to quit smoking - CBC with Differential/Platelet - Varenicline Tartrate, Starter, (CHANTIX STARTING MONTH PAK) 0.5 MG X 11 & 1 MG X 42 TBPK; Start : 0.5 mg PO qd x3  days, then 0.$RemoveBefo'5mg'NbzUsmDPFsF$  Po bid x 4 days.Give with food, start drug 1wk before quit date if quit date planned;stop smoking 8-35 days after starting drug if quit date unplanned; initial tx=12wk  Dispense: 85 each; Refill: 0  Colon cancer screening  - Ambulatory referral to gastroenterology for colonoscopy  Need for hepatitis C screening test  - Hepatitis C antibody  Encounter for screening for HIV  - HIV Antibody (routine testing w rflx)  Encounter for special screening examination for cardiovascular disorder  - Lipid panel  Abnormal CBC  - CBC with Differential/Platelet   FU as scheduled  The patient was advised to call back or seek  an in-person evaluation if the symptoms worsen or if the condition fails to improve as anticipated.  I discussed the assessment and treatment plan with the patient. The patient was provided an opportunity to ask questions and all were answered. The patient agreed with the plan and demonstrated an understanding of the instructions.  The entirety of the information documented in the History of Present Illness, Review of Systems and Physical Exam were personally obtained by me. Portions of this information were initially documented by the CMA and reviewed by me for thoroughness and accuracy.  Portions of this note were created using dictation software and may contain typographical errors.     [name], PA-C  [facility] [phone] (phone) [phone] (fax)  [place]

## 2021-10-03 NOTE — Telephone Encounter (Signed)
Gastroenterology Pre-Procedure Review  Request Date: 11/08/2021 Requesting Physician: Dr. Vicente Males  PATIENT REVIEW QUESTIONS: The patient responded to the following health history questions as indicated:    1. Are you having any GI issues? no 2. Do you have a personal history of Polyps? no 3. Do you have a family history of Colon Cancer or Polyps? no 4. Diabetes Mellitus? no 5. Joint replacements in the past 12 months?no 6. Major health problems in the past 3 months?no 7. Any artificial heart valves, MVP, or defibrillator?no    MEDICATIONS & ALLERGIES:    Patient reports the following regarding taking any anticoagulation/antiplatelet therapy:   Plavix, Coumadin, Eliquis, Xarelto, Lovenox, Pradaxa, Brilinta, or Effient? no Aspirin? no  Patient confirms/reports the following medications:  Current Outpatient Medications  Medication Sig Dispense Refill   losartan-hydrochlorothiazide (HYZAAR) 50-12.5 MG tablet Take 1 tablet by mouth daily. 90 tablet 0   Varenicline Tartrate, Starter, (CHANTIX STARTING MONTH PAK) 0.5 MG X 11 & 1 MG X 42 TBPK Start : 0.5 mg PO qd x3  days, then 0.'5mg'$  Po bid x 4 days.Give with food, start drug 1wk before quit date if quit date planned;stop smoking 8-35 days after starting drug if quit date unplanned; initial tx=12wk 66 each 0   No current facility-administered medications for this visit.    Patient confirms/reports the following allergies:  No Known Allergies  No orders of the defined types were placed in this encounter.   AUTHORIZATION INFORMATION Primary Insurance: 1D#: Group #:  Secondary Insurance: 1D#: Group #:  SCHEDULE INFORMATION: Date: 11/08/2021 Time: Location: [place]

## 2021-10-05 DIAGNOSIS — R609 Edema, unspecified: Secondary | ICD-10-CM | POA: Insufficient documentation

## 2021-10-05 DIAGNOSIS — Z0000 Encounter for general adult medical examination without abnormal findings: Secondary | ICD-10-CM | POA: Insufficient documentation

## 2021-10-05 DIAGNOSIS — F17200 Nicotine dependence, unspecified, uncomplicated: Secondary | ICD-10-CM | POA: Insufficient documentation

## 2021-10-05 DIAGNOSIS — Z1159 Encounter for screening for other viral diseases: Secondary | ICD-10-CM | POA: Insufficient documentation

## 2021-10-05 DIAGNOSIS — Z114 Encounter for screening for human immunodeficiency virus [HIV]: Secondary | ICD-10-CM | POA: Insufficient documentation

## 2021-10-05 DIAGNOSIS — Z1211 Encounter for screening for malignant neoplasm of colon: Secondary | ICD-10-CM | POA: Insufficient documentation

## 2021-10-08 ENCOUNTER — Telehealth: Payer: Self-pay

## 2021-10-08 ENCOUNTER — Emergency Department
Admission: EM | Admit: 2021-10-08 | Discharge: 2021-10-08 | Disposition: A | Discharge status: Home / Self Care | Payer: 59 | Attending: Emergency Medicine | Admitting: Emergency Medicine

## 2021-10-08 ENCOUNTER — Other Ambulatory Visit: Payer: Self-pay

## 2021-10-08 ENCOUNTER — Emergency Department: Payer: 59

## 2021-10-08 DIAGNOSIS — M25511 Pain in right shoulder: Secondary | ICD-10-CM | POA: Diagnosis present

## 2021-10-08 MED ORDER — MORPHINE SULFATE (PF) 4 MG/ML IV SOLN
4.0000 mg | Freq: Once | INTRAVENOUS | Status: AC
  Administered 2021-10-08: 4 mg via INTRAMUSCULAR
  Filled 2021-10-08: qty 1

## 2021-10-08 MED ORDER — OXYCODONE-ACETAMINOPHEN 5-325 MG PO TABS: 1.0000 | ORAL_TABLET | ORAL | 0 refills | Status: DC | PRN

## 2021-10-08 NOTE — Telephone Encounter (Signed)
Copied from [place] [id number]. Topic: Appointment Scheduling - Scheduling Inquiry for Clinic >> Oct 08, 2021  8:44 AM Marcellus Scott wrote: Reason for CRM: Pt called in stated he was supposed to have an appointment scheduled to check bp this week. However, no appointments are scheduled.  Offered to go ahead and schedule pt declined stated he wanted PCP to tell him when she wanted to see him. Please advise.

## 2021-10-08 NOTE — Telephone Encounter (Signed)
Appt made for 10/11/2021 at 840am
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
## 2021-10-11 ENCOUNTER — Encounter: Payer: Self-pay | Admitting: Physician Assistant

## 2021-10-11 ENCOUNTER — Ambulatory Visit: Payer: 59 | Admitting: Physician Assistant

## 2021-10-11 VITALS — BP 145/102 | HR 87 | Temp 98.5°F | Resp 16 | Wt 307.0 lb

## 2021-10-11 DIAGNOSIS — R7989 Other specified abnormal findings of blood chemistry: Secondary | ICD-10-CM | POA: Diagnosis not present

## 2021-10-11 DIAGNOSIS — I10 Essential (primary) hypertension: Secondary | ICD-10-CM

## 2021-10-11 DIAGNOSIS — Z716 Tobacco abuse counseling: Secondary | ICD-10-CM

## 2021-10-11 MED ORDER — AMLODIPINE BESYLATE 5 MG PO TABS: 5.0000 mg | ORAL_TABLET | Freq: Every day | ORAL | 0 refills | Status: DC
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
## 2021-10-12 LAB — LIPID PANEL
Chol/HDL Ratio: 3.3 ratio (ref 0.0–5.0)
Cholesterol, Total: 153 mg/dL (ref 100–199)
HDL: 46 mg/dL (ref >39)
LDL Chol Calc (NIH): 82 mg/dL (ref 0–99)
Triglycerides: 142 mg/dL (ref 0–149)
VLDL Cholesterol Cal: 25 mg/dL (ref 5–40)

## 2021-10-12 LAB — CBC WITH DIFFERENTIAL/PLATELET
Basophils Absolute: 0.1 10*3/uL (ref 0.0–0.2)
Basos: 0 %
EOS (ABSOLUTE): 0.3 10*3/uL (ref 0.0–0.4)
Eos: 2 %
Hematocrit: 43.8 % (ref 37.5–51.0)
Hemoglobin: 14.4 g/dL (ref 13.0–17.7)
Immature Grans (Abs): 0.1 10*3/uL (ref 0.0–0.1)
Immature Granulocytes: 1 %
Lymphocytes Absolute: 2.5 10*3/uL (ref 0.7–3.1)
Lymphs: 20 %
MCH: 26.7 pg (ref 26.6–33.0)
MCHC: 32.9 g/dL (ref 31.5–35.7)
MCV: 81 fL (ref 79–97)
Monocytes Absolute: 1 10*3/uL — ABNORMAL HIGH (ref 0.1–0.9)
Monocytes: 8 %
Neutrophils Absolute: 8.9 10*3/uL — ABNORMAL HIGH (ref 1.4–7.0)
Neutrophils: 69 %
Platelets: 349 10*3/uL (ref 150–450)
RBC: 5.39 x10E6/uL (ref 4.14–5.80)
RDW: 15.7 % — ABNORMAL HIGH (ref 11.6–15.4)
WBC: 12.8 10*3/uL — ABNORMAL HIGH (ref 3.4–10.8)

## 2021-10-12 LAB — HEPATITIS C ANTIBODY: Hep C Virus Ab: NONREACTIVE

## 2021-10-12 LAB — HEMOGLOBIN A1C
Est. average glucose Bld gHb Est-mCnc: 148 mg/dL
Hgb A1c MFr Bld: 6.8 % — ABNORMAL HIGH (ref 4.8–5.6)

## 2021-10-12 LAB — HIV ANTIBODY (ROUTINE TESTING W REFLEX): HIV Screen 4th Generation wRfx: NONREACTIVE

## 2021-10-25 ENCOUNTER — Ambulatory Visit: Payer: 59 | Admitting: Physician Assistant

## 2021-10-25 ENCOUNTER — Encounter: Payer: Self-pay | Admitting: Physician Assistant

## 2021-10-25 VITALS — BP 150/94 | HR 90 | Resp 16 | Ht 73.0 in | Wt 309.0 lb

## 2021-10-25 DIAGNOSIS — R7989 Other specified abnormal findings of blood chemistry: Secondary | ICD-10-CM

## 2021-10-25 DIAGNOSIS — F17200 Nicotine dependence, unspecified, uncomplicated: Secondary | ICD-10-CM | POA: Diagnosis not present

## 2021-10-25 DIAGNOSIS — I10 Essential (primary) hypertension: Secondary | ICD-10-CM

## 2021-10-25 DIAGNOSIS — Z789 Other specified health status: Secondary | ICD-10-CM

## 2021-10-25 MED ORDER — LOSARTAN POTASSIUM-HCTZ 100-25 MG PO TABS: 1.0000 | ORAL_TABLET | Freq: Every day | ORAL | 0 refills | Status: DC

## 2021-10-25 NOTE — Progress Notes (Signed)
I,April Miller,acting as a Education administrator for [employer], PA-C.,have documented all relevant documentation on the behalf of [name], PA-C,as directed by  [employer], PA-C while in the presence of [employer], PA-C.   Established patient visit   Patient: Curtis [name]   DOB: 06-23-1975   46 y.o. Male  MRN: [id number] Visit Date: 10/25/2021  Today's healthcare provider: [name], PA-C   Chief Complaint  Patient presents with   Follow-up   Hypertension   Subjective    HPI  Hypertension, follow-up  BP Readings from Last 3 Encounters:  10/25/21 (!) 150/94  10/11/21 (!) 145/102  10/08/21 (!) 149/113   Wt Readings from Last 3 Encounters:  10/25/21 (!) 309 lb (140.2 kg)  10/11/21 (!) 307 lb (139.3 kg)  10/08/21 (!) 310 lb (140.6 kg)     He was last seen for hypertension 2 weeks ago.  Management since that visit includes; Continue BP log and bring his log with him to the next appt.  Outside blood pressures are not checking.  Pertinent labs Lab Results  Component Value Date   CHOL 153 10/11/2021   HDL 46 10/11/2021   LDLCALC 82 10/11/2021   TRIG 142 10/11/2021   CHOLHDL 3.3 10/11/2021   Lab Results  Component Value Date   NA 142 09/05/2021   K 4.6 09/05/2021   CREATININE 0.93 09/05/2021   EGFR 103 09/05/2021   GLUCOSE 105 (H) 09/05/2021   TSH 1.440 09/05/2021     The 10-year ASCVD risk score (Arnett DK, et al., 2019) is: 27.1%  ---------------------------------------------------------------------------------------------------   Medications: Outpatient Medications Prior to Visit  Medication Sig   amLODipine (NORVASC) 5 MG tablet Take 1 tablet (5 mg total) by mouth daily.   losartan-hydrochlorothiazide (HYZAAR) 50-12.5 MG tablet Take 1 tablet by mouth daily. (Patient taking differently: Take 2 tablets by mouth daily.)   oxyCODONE-acetaminophen (PERCOCET) 5-325 MG tablet Take 1 tablet by mouth every 4 (four) hours as needed for severe pain.    Varenicline Tartrate, Starter, (CHANTIX STARTING MONTH PAK) 0.5 MG X 11 & 1 MG X 42 TBPK Start : 0.5 mg PO qd x3  days, then 0.65m Po bid x 4 days.Give with food, start drug 1wk before quit date if quit date planned;stop smoking 8-35 days after starting drug if quit date unplanned; initial tx=12wk   No facility-administered medications prior to visit.    Review of Systems  Constitutional:  Negative for appetite change, chills and fever.  Respiratory:  Negative for chest tightness, shortness of breath and wheezing.   Cardiovascular:  Negative for chest pain and palpitations.  Gastrointestinal:  Negative for abdominal pain, nausea and vomiting.       Objective    BP (!) 150/94 (BP Location: Right Arm, Patient Position: Sitting, Cuff Size: Large)   Pulse 90   Resp 16   Ht _0  (1.854 m)   Wt (!) 309 lb (140.2 kg)   SpO2 100%   BMI 40.77 kg/m    Physical Exam Vitals reviewed.  Constitutional:      General: He is not in acute distress.    Appearance: Normal appearance. He is not diaphoretic.  HENT:     Head: Normocephalic and atraumatic.  Eyes:     General: No scleral icterus.    Conjunctiva/sclera: Conjunctivae normal.  Cardiovascular:     Rate and Rhythm: Normal rate and regular rhythm.     Pulses: Normal pulses.     Heart sounds: Normal heart sounds.  No murmur heard. Pulmonary:     Effort: Pulmonary effort is normal. No respiratory distress.     Breath sounds: Normal breath sounds. No wheezing or rhonchi.  Musculoskeletal:     Cervical back: Neck supple.     Right lower leg: No edema.     Left lower leg: No edema.  Lymphadenopathy:     Cervical: No cervical adenopathy.  Skin:    General: Skin is warm and dry.     Findings: No rash.  Neurological:     Mental Status: He is alert and oriented to person, place, and time. Mental status is at baseline.  Psychiatric:        Mood and Affect: Mood normal.        Behavior: Behavior normal.       No results found for  any visits on 10/25/21.  Assessment & Plan     1. Hypertension, unspecified type BP 150/94 not at goal We will increase the hyzaar's dose: - losartan-hydrochlorothiazide (HYZAAR) 100-25 MG tablet; Take 1 tablet by mouth daily.  Dispense: 90 tablet; Refill: 0 Will reassess in a week.   2. Morbid obesity ([place]) Weight loss of 5% of pt's current weight via healthy diet and daily exercise encouraged.  3. Abnormal CBC measurement Will monitor  4. Smoking Continue to take Chantix  5. Alcohol use Share a bottle of liquor with 5 friends Encouraged alcohol cessation  FU as scheduled     The patient was advised to call back or seek an in-person evaluation if the symptoms worsen or if the condition fails to improve as anticipated.  I discussed the assessment and treatment plan with the patient. The patient was provided an opportunity to ask questions and all were answered. The patient agreed with the plan and demonstrated an understanding of the instructions.  The entirety of the information documented in the History of Present Illness, Review of Systems and Physical Exam were personally obtained by me. Portions of this information were initially documented by the CMA and reviewed by me for thoroughness and accuracy.  Portions of this note were created using dictation software and may contain typographical errors.     [name], PA-C  [facility] [phone] (phone) [phone] (fax)  [place]

## 2021-10-29 ENCOUNTER — Other Ambulatory Visit: Payer: Self-pay | Admitting: Physician Assistant

## 2021-10-29 DIAGNOSIS — F17200 Nicotine dependence, unspecified, uncomplicated: Secondary | ICD-10-CM

## 2021-10-29 MED ORDER — VARENICLINE TARTRATE 1 MG PO TABS: 1.0000 mg | ORAL_TABLET | Freq: Two times a day (BID) | ORAL | 1 refills | Status: DC

## 2021-10-31 ENCOUNTER — Ambulatory Visit: Payer: 59 | Admitting: Physician Assistant

## 2021-11-06 ENCOUNTER — Ambulatory Visit: Payer: 59 | Admitting: Physician Assistant

## 2021-11-08 ENCOUNTER — Ambulatory Visit
Admission: RE | Admit: 2021-11-08 | Discharge: 2021-11-08 | Disposition: A | Discharge status: Home / Self Care | Payer: 59 | Source: Ambulatory Visit | Attending: Gastroenterology | Admitting: Gastroenterology

## 2021-11-08 ENCOUNTER — Encounter
Admission: RE | Disposition: A | Discharge status: Home / Self Care | Payer: Self-pay | Source: Ambulatory Visit | Attending: Gastroenterology

## 2021-11-08 ENCOUNTER — Ambulatory Visit: Payer: 59 | Admitting: Certified Registered Nurse Anesthetist

## 2021-11-08 DIAGNOSIS — Z6841 Body Mass Index (BMI) 40.0 and over, adult: Secondary | ICD-10-CM | POA: Diagnosis not present

## 2021-11-08 DIAGNOSIS — F17210 Nicotine dependence, cigarettes, uncomplicated: Secondary | ICD-10-CM | POA: Insufficient documentation

## 2021-11-08 DIAGNOSIS — D126 Benign neoplasm of colon, unspecified: Secondary | ICD-10-CM

## 2021-11-08 DIAGNOSIS — Z1211 Encounter for screening for malignant neoplasm of colon: Secondary | ICD-10-CM | POA: Insufficient documentation

## 2021-11-08 DIAGNOSIS — I10 Essential (primary) hypertension: Secondary | ICD-10-CM | POA: Insufficient documentation

## 2021-11-08 DIAGNOSIS — D122 Benign neoplasm of ascending colon: Secondary | ICD-10-CM | POA: Diagnosis not present

## 2021-11-08 HISTORY — PX: COLONOSCOPY WITH PROPOFOL: SHX5780

## 2021-11-08 SURGERY — COLONOSCOPY WITH PROPOFOL
Anesthesia: General

## 2021-11-08 MED ORDER — PROPOFOL 500 MG/50ML IV EMUL
INTRAVENOUS | Status: DC | PRN
  Administered 2021-11-08: 180 ug/kg/min via INTRAVENOUS
  Administered 2021-11-08: 150 mg via INTRAVENOUS

## 2021-11-08 MED ORDER — LIDOCAINE HCL (CARDIAC) PF 100 MG/5ML IV SOSY
PREFILLED_SYRINGE | INTRAVENOUS | Status: DC | PRN
  Administered 2021-11-08: 50 mg via INTRAVENOUS

## 2021-11-08 MED ORDER — PROPOFOL 1000 MG/100ML IV EMUL
INTRAVENOUS | Status: AC
  Filled 2021-11-08: qty 100

## 2021-11-08 MED ORDER — SODIUM CHLORIDE 0.9 % IV SOLN
INTRAVENOUS | Status: DC
  Administered 2021-11-08: 20 mL/h via INTRAVENOUS

## 2021-11-08 NOTE — Op Note (Signed)
[facility] Gastroenterology Patient Name: Curtis [name] Procedure Date: 11/08/2021 8:10 AM MRN: [id number] Account #: [id number] Date of Birth: [birth year] Admit Type: Outpatient Age: 46 Room: [facility] ENDO ROOM 3 Gender: Male Note Status: Finalized Instrument Name: Park Meo [id number] Procedure:             Colonoscopy Indications:           Screening for colorectal malignant neoplasm Providers:             Jonathon Bellows MD, MD Referring MD:          Dominic Pea, PA-c Medicines:             Monitored Anesthesia Care Complications:         No immediate complications. Procedure:             Pre-Anesthesia Assessment:                        - Prior to the procedure, a History and Physical was                         performed, and patient medications, allergies and                         sensitivities were reviewed. The patient's tolerance                         of previous anesthesia was reviewed.                        - The risks and benefits of the procedure and the                         sedation options and risks were discussed with the                         patient. All questions were answered and informed                         consent was obtained.                        - ASA Grade Assessment: II - A patient with mild                         systemic disease.                        After obtaining informed consent, the colonoscope was                         passed under direct vision. Throughout the procedure,                         the patient's blood pressure, pulse, and oxygen                         saturations were monitored continuously. The                         Colonoscope was introduced through the  anus and                         advanced to the the cecum, identified by the                         appendiceal orifice. The colonoscopy was performed                         with ease. The patient tolerated the procedure well.                          The quality of the bowel preparation was excellent. Findings:      The perianal and digital rectal examinations were normal.      A 5 mm polyp was found in the ascending colon. The polyp was sessile.       The polyp was removed with a cold snare. Resection and retrieval were       complete.      The exam was otherwise without abnormality on direct and retroflexion       views. Impression:            - One 5 mm polyp in the ascending colon, removed with                         a cold snare. Resected and retrieved.                        - The examination was otherwise normal on direct and                         retroflexion views. Recommendation:        - Discharge patient to home (with escort).                        - Resume previous diet.                        - Continue present medications.                        - Await pathology results.                        - Repeat colonoscopy for surveillance based on                         pathology results. Procedure Code(s):     --- Professional ---                        [phone], Colonoscopy, flexible; with removal of                         tumor(s), polyp(s), or other lesion(s) by snare                         technique Diagnosis Code(s):     --- Professional ---                        Z12.11, Encounter  for screening for malignant neoplasm                         of colon                        D12.2, Benign neoplasm of ascending colon CPT copyright 2022 American Medical Association. All rights reserved. The codes documented in this report are preliminary and upon coder review may  be revised to meet current compliance requirements. Jonathon Bellows, MD Jonathon Bellows MD, MD 11/08/2021 8:39:57 AM This report has been signed electronically. Number of Addenda: 0 Note Initiated On: 11/08/2021 8:10 AM Scope Withdrawal Time: 0 hours 9 minutes 5 seconds  Total Procedure Duration: 0 hours 10 minutes 4 seconds  Estimated Blood Loss:   Estimated blood loss: none.      [facility]

## 2021-11-08 NOTE — H&P (Signed)
Curtis Bellows, MD [address], [place], [place], [place], [address] [address], [place], [place], [place], [address] Phone: [phone]  Fax: [phone]  Primary Care Physician:  [name], PA-C   Pre-Procedure History & Physical: HPI:  Curtis [name] is a 46 y.o. male is here for an colonoscopy.   Past Medical History:  Diagnosis Date   Allergies     No past surgical history on file.  Prior to Admission medications   Medication Sig Start Date End Date Taking? Authorizing Provider  amLODipine (NORVASC) 5 MG tablet Take 1 tablet (5 mg total) by mouth daily. 10/11/21  Yes Ostwalt, Letitia Libra, PA-C  losartan-hydrochlorothiazide (HYZAAR) 100-25 MG tablet Take 1 tablet by mouth daily. 10/25/21  Yes Ostwalt, Letitia Libra, PA-C  losartan-hydrochlorothiazide (HYZAAR) 50-12.5 MG tablet Take 1 tablet by mouth daily. Patient taking differently: Take 2 tablets by mouth daily. 09/05/21  Yes Ostwalt, Letitia Libra, PA-C  oxyCODONE-acetaminophen (PERCOCET) 5-325 MG tablet Take 1 tablet by mouth every 4 (four) hours as needed for severe pain. 10/08/21  Yes Harvest Dark, MD  varenicline (CHANTIX) 1 MG tablet Take 1 tablet (1 mg total) by mouth 2 (two) times daily. 10/29/21  Yes Ostwalt, Letitia Libra, PA-C    Allergies as of 10/03/2021   (No Known Allergies)    Family History  Problem Relation Age of Onset   Diabetes Maternal Uncle    Diabetes Paternal Uncle     Social History   Socioeconomic History   Marital status: Married    Spouse name: Not on file   Number of children: Not on file   Years of education: Not on file   Highest education level: Not on file  Occupational History   Not on file  Tobacco Use   Smoking status: Every Day    Packs/day: 1.50    Types: Cigarettes   Smokeless tobacco: Never  Vaping Use   Vaping Use: Not on file  Substance and Sexual Activity   Alcohol use: Yes    Alcohol/week: 10.0 standard drinks of alcohol    Types: 5 Cans of beer, 5 Shots of liquor  per week   Drug use: No   Sexual activity: Not on file  Other Topics Concern   Not on file  Social History Narrative   Not on file   Social Determinants of Health   Financial Resource Strain: Not on file  Food Insecurity: Not on file  Transportation Needs: Not on file  Physical Activity: Not on file  Stress: Not on file  Social Connections: Not on file  Intimate Partner Violence: Not on file    Review of Systems: See HPI, otherwise negative ROS  Physical Exam: BP (!) 144/102   Pulse 85   Temp (!) 96.4 F (35.8 C) (Temporal)   Resp 20   Ht '6\' 1"'$  (1.854 m)   Wt (!) 141.1 kg   SpO2 98%   BMI 41.03 kg/m  General:   Alert,  pleasant and cooperative in NAD Head:  Normocephalic and atraumatic. Neck:  Supple; no masses or thyromegaly. Lungs:  Clear throughout to auscultation, normal respiratory effort.    Heart:  +S1, +S2, Regular rate and rhythm, No edema. Abdomen:  Soft, nontender and nondistended. Normal bowel sounds, without guarding, and without rebound.   Neurologic:  Alert and  oriented x4;  grossly normal neurologically.  Impression/Plan: Curtis [name] is here for an colonoscopy to be performed for Screening colonoscopy average risk   Risks, benefits, limitations, and alternatives  regarding  colonoscopy have been reviewed with the patient.  Questions have been answered.  All parties agreeable.   Curtis Bellows, MD  11/08/2021, 8:12 AM

## 2021-11-08 NOTE — Transfer of Care (Signed)
Immediate Anesthesia Transfer of Care Note  Patient: Curtis [name]  Procedure(s) Performed: COLONOSCOPY WITH PROPOFOL  Patient Location: PACU and Endoscopy Unit  Anesthesia Type:General  Level of Consciousness: awake and patient cooperative  Airway & Oxygen Therapy: Patient Spontanous Breathing and Patient connected to face mask oxygen  Post-op Assessment: Report given to RN  Post vital signs: Reviewed and stable  Last Vitals:  Vitals Value Taken Time  BP 112/76 11/08/21 0837  Temp    Pulse 100 11/08/21 0837  Resp 24 11/08/21 0837  SpO2 100 % 11/08/21 0837  Vitals shown include unvalidated device data.  Last Pain:  Vitals:   11/08/21 0835  TempSrc:   PainSc: 0-No pain         Complications: No notable events documented.

## 2021-11-08 NOTE — Anesthesia Preprocedure Evaluation (Signed)
Anesthesia Evaluation  Patient identified by MRN, date of birth, ID band Patient awake    Reviewed: Allergy & Precautions, H&P , NPO status , Patient's Chart, lab work & pertinent test results, reviewed documented beta blocker date and time   Airway Mallampati: II   Neck ROM: full    Dental  (+) Poor Dentition   Pulmonary shortness of breath, Current Smoker and Patient abstained from smoking.,    Pulmonary exam normal        Cardiovascular Exercise Tolerance: Good hypertension, On Medications Normal cardiovascular exam Rhythm:regular Rate:Normal     Neuro/Psych negative neurological ROS  negative psych ROS   GI/Hepatic negative GI ROS, Neg liver ROS,   Endo/Other  Morbid obesity  Renal/GU negative Renal ROS  negative genitourinary   Musculoskeletal   Abdominal   Peds  Hematology negative hematology ROS (+)   Anesthesia Other Findings Past Medical History: No date: Allergies No past surgical history on file. BMI    Body Mass Index: 41.03 kg/m     Reproductive/Obstetrics negative OB ROS                             Anesthesia Physical Anesthesia Plan  ASA: 3  Anesthesia Plan: General   Post-op Pain Management:    Induction:   PONV Risk Score and Plan:   Airway Management Planned:   Additional Equipment:   Intra-op Plan:   Post-operative Plan:   Informed Consent: I have reviewed the patients History and Physical, chart, labs and discussed the procedure including the risks, benefits and alternatives for the proposed anesthesia with the patient or authorized representative who has indicated his/her understanding and acceptance.     Dental Advisory Given  Plan Discussed with: CRNA  Anesthesia Plan Comments:         Anesthesia Quick Evaluation

## 2021-11-09 ENCOUNTER — Encounter: Payer: Self-pay | Admitting: Gastroenterology

## 2021-11-09 LAB — SURGICAL PATHOLOGY

## 2021-11-09 NOTE — Anesthesia Postprocedure Evaluation (Signed)
Anesthesia Post Note  Patient: Curtis [name]  Procedure(s) Performed: COLONOSCOPY WITH PROPOFOL  Patient location during evaluation: PACU Anesthesia Type: General Level of consciousness: awake and alert Pain management: pain level controlled Vital Signs Assessment: post-procedure vital signs reviewed and stable Respiratory status: spontaneous breathing, nonlabored ventilation, respiratory function stable and patient connected to nasal cannula oxygen Cardiovascular status: blood pressure returned to baseline and stable Postop Assessment: no apparent nausea or vomiting Anesthetic complications: no   No notable events documented.   Last Vitals:  Vitals:   11/08/21 0835 11/08/21 0848  BP: 112/76 129/86  Pulse: 97 90  Resp: (!) 25 15  Temp:    SpO2: 100% 100%    Last Pain:  Vitals:   11/08/21 0848  TempSrc:   PainSc: 0-No pain                 Molli Barrows

## 2021-11-09 NOTE — Progress Notes (Signed)
Results of pathology /colonocscopy are back.  Negative for high-grade dysplasia and malignancy. You will receive a letter from GI shortly.

## 2021-11-12 ENCOUNTER — Encounter: Payer: Self-pay | Admitting: Gastroenterology

## 2021-11-13 ENCOUNTER — Ambulatory Visit: Payer: 59 | Admitting: Physician Assistant

## 2021-11-13 NOTE — Progress Notes (Deleted)
      Established patient visit   Patient: Curtis [name]   DOB: Mar 31, 1975   46 y.o. Male  MRN: [id number] Visit Date: 11/13/2021  Today's healthcare provider: [name], PA-C   No chief complaint on file.  Subjective    HPI  Hypertension, follow-up  BP Readings from Last 3 Encounters:  11/08/21 129/86  10/25/21 (!) 150/94  10/11/21 (!) 145/102   Wt Readings from Last 3 Encounters:  11/08/21 (!) 311 lb (141.1 kg)  10/25/21 (!) 309 lb (140.2 kg)  10/11/21 (!) 307 lb (139.3 kg)     He was last seen for hypertension 1  week  ago.  BP at that visit was 150/94. Management since that visit includes increasing the dose of Hyzaar to losartan-hydrochlorothiazide (HYZAAR) 100-25 MG tablet; Take 1 tablet by mouth daily.  He reports {excellent/good/fair/poor:19665} compliance with treatment. He {is/is not:9024} having side effects. {document side effects if present:1} He is following a {diet:21022986} diet. He {is/is not:9024} exercising. He {does/does not:200015} smoke.  Use of agents associated with hypertension: none.   Outside blood pressures are {***enter patient reported home BP readings, or 'not being checked':1}. Symptoms: {Yes/No:20286} chest pain {Yes/No:20286} chest pressure  {Yes/No:20286} palpitations {Yes/No:20286} syncope  {Yes/No:20286} dyspnea {Yes/No:20286} orthopnea  {Yes/No:20286} paroxysmal nocturnal dyspnea {Yes/No:20286} lower extremity edema   Pertinent labs Lab Results  Component Value Date   CHOL 153 10/11/2021   HDL 46 10/11/2021   LDLCALC 82 10/11/2021   TRIG 142 10/11/2021   CHOLHDL 3.3 10/11/2021   Lab Results  Component Value Date   NA 142 09/05/2021   K 4.6 09/05/2021   CREATININE 0.93 09/05/2021   EGFR 103 09/05/2021   GLUCOSE 105 (H) 09/05/2021   TSH 1.440 09/05/2021     The 10-year ASCVD risk score (Arnett DK, et al., 2019) is:  21.1%  ---------------------------------------------------------------------------------------------------   Medications: Outpatient Medications Prior to Visit  Medication Sig   amLODipine (NORVASC) 5 MG tablet Take 1 tablet (5 mg total) by mouth daily.   losartan-hydrochlorothiazide (HYZAAR) 100-25 MG tablet Take 1 tablet by mouth daily.   losartan-hydrochlorothiazide (HYZAAR) 50-12.5 MG tablet Take 1 tablet by mouth daily. (Patient taking differently: Take 2 tablets by mouth daily.)   oxyCODONE-acetaminophen (PERCOCET) 5-325 MG tablet Take 1 tablet by mouth every 4 (four) hours as needed for severe pain.   varenicline (CHANTIX) 1 MG tablet Take 1 tablet (1 mg total) by mouth 2 (two) times daily.   No facility-administered medications prior to visit.    Review of Systems  {Labs  Heme  Chem  Endocrine  Serology  Results Review (optional):23779}   Objective    There were no vitals taken for this visit. {Show previous vital signs (optional):23777}  Physical Exam  ***  No results found for any visits on 11/13/21.  Assessment & Plan     ***  No follow-ups on file.      {provider attestation***:1}   [name], Hershal Coria  [facility] [phone] (phone) [phone] (fax)  [place]

## 2021-11-18 ENCOUNTER — Other Ambulatory Visit: Payer: Self-pay | Admitting: Physician Assistant

## 2021-11-18 DIAGNOSIS — F17200 Nicotine dependence, unspecified, uncomplicated: Secondary | ICD-10-CM

## 2021-11-19 NOTE — Telephone Encounter (Signed)
Requested Prescriptions  Pending Prescriptions Disp Refills   varenicline (CHANTIX) 1 MG tablet [Pharmacy Med Name: VARENICLINE 1 MG TABLET] 168 tablet 1    Sig: TAKE 1 TABLET BY MOUTH TWICE A DAY     Psychiatry:  Drug Dependence Therapy - varenicline Failed - 11/18/2021  1:31 PM      Failed - Manual Review: Do not refill starter pack. 1 mg tabs may be extended up to one year if the patient has quit smoking but still feels at risk for relapse.      Passed - Cr in normal range and within 180 days    Creatinine  Date Value Ref Range Status  11/17/2013 1.01 0.60 - 1.30 mg/dL Final   Creatinine, Ser  Date Value Ref Range Status  09/05/2021 0.93 0.76 - 1.27 mg/dL Final         Passed - Completed PHQ-2 or PHQ-9 in the last 360 days      Passed - Valid encounter within last 6 months    Recent Outpatient Visits           3 weeks ago Hypertension, unspecified type   [facility] [employer], [place], PA-C   1 month ago Hypertension, unspecified type   [employer], [place], PA-C   1 month ago Annual physical exam   [employer], [place], PA-C   2 months ago Morbid obesity ([place])   [place], [place], PA-C   2 months ago Encounter for medical examination to establish care   [facility] [place], [place], [place]

## 2021-11-24 ENCOUNTER — Other Ambulatory Visit: Payer: Self-pay | Admitting: Physician Assistant

## 2021-11-24 DIAGNOSIS — E1159 Type 2 diabetes mellitus with other circulatory complications: Secondary | ICD-10-CM

## 2021-11-26 NOTE — Telephone Encounter (Signed)
Requested medications are due for refill today.  yes  Requested medications are on the active medications list.  yes  Last refill. 09/05/2021 #90 0 rf  Future visit scheduled.   no  Notes to clinic.    Pharmacy comment: REQUEST FOR 90 DAYS PRESCRIPTION. DX Code Needed.      Requested Prescriptions  Pending Prescriptions Disp Refills   losartan-hydrochlorothiazide (HYZAAR) 50-12.5 MG tablet [Pharmacy Med Name: LOSARTAN-HCTZ 50-12.5 MG TAB] 90 tablet 1    Sig: TAKE 1 TABLET BY MOUTH EVERY DAY     Cardiovascular: ARB + Diuretic Combos Passed - 11/24/2021  2:31 PM      Passed - K in normal range and within 180 days    Potassium  Date Value Ref Range Status  09/05/2021 4.6 3.5 - 5.2 mmol/L Final  11/17/2013 3.4 (L) 3.5 - 5.1 mmol/L Final         Passed - Na in normal range and within 180 days    Sodium  Date Value Ref Range Status  09/05/2021 142 134 - 144 mmol/L Final  11/17/2013 137 136 - 145 mmol/L Final         Passed - Cr in normal range and within 180 days    Creatinine  Date Value Ref Range Status  11/17/2013 1.01 0.60 - 1.30 mg/dL Final   Creatinine, Ser  Date Value Ref Range Status  09/05/2021 0.93 0.76 - 1.27 mg/dL Final         Passed - eGFR is 10 or above and within 180 days    EGFR (African American)  Date Value Ref Range Status  11/17/2013 >60 >35m/min Final   GFR calc Af Amer  Date Value Ref Range Status  02/16/2018 >60 >60 mL/min Final   EGFR (Non-African Amer.)  Date Value Ref Range Status  11/17/2013 >60 >611mmin Final    Comment:    eGFR values <6051min/1.73 m2 may be an indication of chronic kidney disease (CKD). Calculated eGFR, using the MRDR Study equation, is useful in  patients with stable renal function. The eGFR calculation will not be reliable in acutely ill patients when serum creatinine is changing rapidly. It is not useful in patients on dialysis. The eGFR calculation may not be applicable to patients at the low and high  extremes of body sizes, pregnant women, and vegetarians.    GFR calc non Af Amer  Date Value Ref Range Status  02/16/2018 >60 >60 mL/min Final   eGFR  Date Value Ref Range Status  09/05/2021 103 >59 mL/min/1.73 Final         Passed - Patient is not pregnant      Passed - Last BP in normal range    BP Readings from Last 1 Encounters:  11/08/21 129/86         Passed - Valid encounter within last 6 months    Recent Outpatient Visits           1 month ago Hypertension, unspecified type   [facility]   1 month ago Hypertension, unspecified type   [facility]   1 month ago Annual physical exam   [facility]   2 months ago Morbid obesity (HCCSp[place] BurLebanontHeronanCavourA-C   2 months ago Encounter for medical examination to establish care   [facility] [place]

## 2021-11-28 ENCOUNTER — Other Ambulatory Visit: Payer: Self-pay | Admitting: Physician Assistant

## 2021-11-28 DIAGNOSIS — E1159 Type 2 diabetes mellitus with other circulatory complications: Secondary | ICD-10-CM

## 2021-11-28 NOTE — Telephone Encounter (Signed)
Unable to refill per protocol, Rx request is too soon. Last refill was 10/25/21 for 90. Will refuse.  Requested Prescriptions  Pending Prescriptions Disp Refills   losartan-hydrochlorothiazide (HYZAAR) 50-12.5 MG tablet [Pharmacy Med Name: LOSARTAN-HCTZ 50-12.5 MG TAB] 30 tablet 2    Sig: TAKE 1 TABLET BY MOUTH EVERY DAY     Cardiovascular: ARB + Diuretic Combos Passed - 11/28/2021  2:04 AM      Passed - K in normal range and within 180 days    Potassium  Date Value Ref Range Status  09/05/2021 4.6 3.5 - 5.2 mmol/L Final  11/17/2013 3.4 (L) 3.5 - 5.1 mmol/L Final         Passed - Na in normal range and within 180 days    Sodium  Date Value Ref Range Status  09/05/2021 142 134 - 144 mmol/L Final  11/17/2013 137 136 - 145 mmol/L Final         Passed - Cr in normal range and within 180 days    Creatinine  Date Value Ref Range Status  11/17/2013 1.01 0.60 - 1.30 mg/dL Final   Creatinine, Ser  Date Value Ref Range Status  09/05/2021 0.93 0.76 - 1.27 mg/dL Final         Passed - eGFR is 10 or above and within 180 days    EGFR (African American)  Date Value Ref Range Status  11/17/2013 >60 >20m/min Final   GFR calc Af Amer  Date Value Ref Range Status  02/16/2018 >60 >60 mL/min Final   EGFR (Non-African Amer.)  Date Value Ref Range Status  11/17/2013 >60 >628mmin Final    Comment:    eGFR values <6024min/1.73 m2 may be an indication of chronic kidney disease (CKD). Calculated eGFR, using the MRDR Study equation, is useful in  patients with stable renal function. The eGFR calculation will not be reliable in acutely ill patients when serum creatinine is changing rapidly. It is not useful in patients on dialysis. The eGFR calculation may not be applicable to patients at the low and high extremes of body sizes, pregnant women, and vegetarians.    GFR calc non Af Amer  Date Value Ref Range Status  02/16/2018 >60 >60 mL/min Final   eGFR  Date Value Ref Range Status   09/05/2021 103 >59 mL/min/1.73 Final         Passed - Patient is not pregnant      Passed - Last BP in normal range    BP Readings from Last 1 Encounters:  11/08/21 129/86         Passed - Valid encounter within last 6 months    Recent Outpatient Visits           1 month ago Hypertension, unspecified type   [facility] [place]   1 month ago Hypertension, unspecified type   [facility] [place]   1 month ago Annual physical exam   [facility] Insurance[facility]A-[facility]   2 months ago Morbid obesity (HCCMid[place] BurBerry [place]evieran[place]A-C   2 months ago Encounter for medical examination to establish care   [facility] [place]

## 2021-12-03 ENCOUNTER — Other Ambulatory Visit: Payer: Self-pay | Admitting: Physician Assistant

## 2021-12-03 DIAGNOSIS — I10 Essential (primary) hypertension: Secondary | ICD-10-CM

## 2021-12-03 NOTE — Telephone Encounter (Signed)
Refilled 10/11/2021 #90 0 rf Requested Prescriptions  Pending Prescriptions Disp Refills   amLODipine (NORVASC) 5 MG tablet [Pharmacy Med Name: AMLODIPINE BESYLATE 5 MG TAB] 90 tablet 1    Sig: TAKE 1 TABLET (5 MG TOTAL) BY MOUTH DAILY.     Cardiovascular: Calcium Channel Blockers 2 Passed - 12/03/2021 11:31 AM      Passed - Last BP in normal range    BP Readings from Last 1 Encounters:  11/08/21 129/86         Passed - Last Heart Rate in normal range    Pulse Readings from Last 1 Encounters:  11/08/21 90         Passed - Valid encounter within last 6 months    Recent Outpatient Visits           1 month ago Hypertension, unspecified type   [facility] [place], [place], PA-C   1 month ago Hypertension, unspecified type   [facility] [place], [place], PA-C   2 months ago Annual physical exam   [employer], [place], PA-C   2 months ago Morbid obesity ([place])   [place], [place], PA-C   2 months ago Encounter for medical examination to establish care   [facility] [place], [place], [place]

## 2021-12-20 ENCOUNTER — Encounter: Payer: Self-pay | Admitting: Physician Assistant

## 2021-12-20 ENCOUNTER — Ambulatory Visit: Payer: 59 | Admitting: Physician Assistant

## 2021-12-20 VITALS — BP 146/100 | HR 91 | Temp 98.3°F | Wt 307.0 lb

## 2021-12-20 DIAGNOSIS — F17200 Nicotine dependence, unspecified, uncomplicated: Secondary | ICD-10-CM | POA: Diagnosis not present

## 2021-12-20 DIAGNOSIS — M7989 Other specified soft tissue disorders: Secondary | ICD-10-CM

## 2021-12-20 DIAGNOSIS — R000 Tachycardia, unspecified: Secondary | ICD-10-CM

## 2021-12-20 DIAGNOSIS — I10 Essential (primary) hypertension: Secondary | ICD-10-CM | POA: Diagnosis not present

## 2021-12-20 DIAGNOSIS — R7989 Other specified abnormal findings of blood chemistry: Secondary | ICD-10-CM

## 2021-12-20 DIAGNOSIS — Z789 Other specified health status: Secondary | ICD-10-CM

## 2021-12-20 MED ORDER — LOSARTAN POTASSIUM-HCTZ 100-25 MG PO TABS: 1.0000 | ORAL_TABLET | Freq: Every day | ORAL | 0 refills | Status: DC

## 2021-12-20 MED ORDER — VARENICLINE TARTRATE 1 MG PO TABS: 1.0000 mg | ORAL_TABLET | Freq: Two times a day (BID) | ORAL | 1 refills | Status: DC

## 2021-12-20 NOTE — Progress Notes (Unsigned)
Established patient visit   Patient: Curtis [name]   DOB: 21-Apr-1975   46 y.o. Male  MRN: [id number] Visit Date: 12/20/2021  Today's healthcare provider: [name], PA-C   Chief Complaint  Patient presents with   Follow-up    Hypertension    Subjective    HPI HPI     Follow-up    Additional comments: Hypertension       Last edited by Araceli Bouche, CMA on 12/20/2021  9:29 AM.      Hypertension, follow-up  BP Readings from Last 3 Encounters:  12/20/21 (!) 146/100  11/08/21 129/86  10/25/21 (!) 150/94   Wt Readings from Last 3 Encounters:  12/20/21 (!) 307 lb (139.3 kg)  11/08/21 (!) 311 lb (141.1 kg)  10/25/21 (!) 309 lb (140.2 kg)     He was last seen for hypertension 2 months ago.  BP at that visit was 150 /94. Management since that visit includes monitoring and logging BP at home.  He reports fair compliance with treatment. T He is having side effects. Erectile dysfunction  He is following a Low Sodium diet. He is exercising. He does smoke.  Use of agents associated with hypertension: none.   Outside blood pressures are not being taken. Symptoms: No chest pain No chest pressure  No palpitations No syncope  Yes dyspnea No orthopnea  No paroxysmal nocturnal dyspnea No lower extremity edema   Pertinent labs Lab Results  Component Value Date   CHOL 153 10/11/2021   HDL 46 10/11/2021   LDLCALC 82 10/11/2021   TRIG 142 10/11/2021   CHOLHDL 3.3 10/11/2021   Lab Results  Component Value Date   NA 142 09/05/2021   K 4.6 09/05/2021   CREATININE 0.93 09/05/2021   EGFR 103 09/05/2021   GLUCOSE 105 (H) 09/05/2021   TSH 1.440 09/05/2021     The 10-year ASCVD risk score (Arnett DK, et al., 2019) is: 27.1%  ---------------------------------------------------------------------------------------------------   Medications: Outpatient Medications Prior to Visit  Medication Sig   amLODipine (NORVASC) 5 MG tablet Take 1 tablet (5 mg  total) by mouth daily.   oxyCODONE-acetaminophen (PERCOCET) 5-325 MG tablet Take 1 tablet by mouth every 4 (four) hours as needed for severe pain.   [DISCONTINUED] losartan-hydrochlorothiazide (HYZAAR) 100-25 MG tablet Take 1 tablet by mouth daily.   [DISCONTINUED] losartan-hydrochlorothiazide (HYZAAR) 50-12.5 MG tablet Take 1 tablet by mouth daily. (Patient taking differently: Take 2 tablets by mouth daily.)   [DISCONTINUED] varenicline (CHANTIX) 1 MG tablet Take 1 tablet (1 mg total) by mouth 2 (two) times daily.   No facility-administered medications prior to visit.    Review of Systems  All other systems reviewed and are negative. Except see HPI  {Labs  Heme  Chem  Endocrine  Serology  Results Review (optional):23779}   Objective    BP (!) 146/100 (BP Location: Left Arm, Patient Position: Sitting, Cuff Size: Normal)   Pulse 91   Temp 98.3 F (36.8 C) (Oral)   Wt (!) 307 lb (139.3 kg)   SpO2 97%   BMI 40.50 kg/m  {Show previous vital signs (optional):23777}  Physical Exam Vitals reviewed.  Constitutional:      General: He is not in acute distress.    Appearance: Normal appearance. He is not diaphoretic.  HENT:     Head: Normocephalic and atraumatic.  Eyes:     General: No scleral icterus.    Conjunctiva/sclera: Conjunctivae normal.  Cardiovascular:     Rate and  Rhythm: Normal rate and regular rhythm.     Pulses: Normal pulses.     Heart sounds: Normal heart sounds. No murmur heard. Pulmonary:     Effort: Pulmonary effort is normal. No respiratory distress.     Breath sounds: Normal breath sounds. No wheezing or rhonchi.  Chest:     Chest wall: Tenderness present.  Musculoskeletal:     Cervical back: Neck supple.     Right lower leg: No edema.     Left lower leg: No edema.  Lymphadenopathy:     Cervical: No cervical adenopathy.  Skin:    General: Skin is warm and dry.     Findings: No rash.  Neurological:     Mental Status: He is alert and oriented to  person, place, and time. Mental status is at baseline.  Psychiatric:        Behavior: Behavior normal.        Thought Content: Thought content normal.        Judgment: Judgment normal.       No results found for any visits on 12/20/21.  Assessment & Plan     Promised to Fu in 2 weeks with normal BP Slightly rough breathing had a cold recently Chantix refilled and BP will continue  1. Hypertension, unspecified type Chronic and  unstable Blood pressure today was 146/100 - losartan-hydrochlorothiazide (HYZAAR) 100-25 MG tablet; Take 1 tablet by mouth daily.  Dispense: 90 tablet; Refill: 0  2. Morbid obesity (HCC) ***  3. Alcohol use ***  4. Smoking Started smoking again Has been having problem at home with his oldest daughter of 22 He would like to continue taking Chantix - varenicline (CHANTIX) 1 MG tablet; Take 1 tablet (1 mg total) by mouth 2 (two) times daily.  Dispense: 56 tablet; Refill: 1 Will reassess 5. Abnormal CBC measurement ***  6. Leg swelling   7. Tachycardia ***  The patient was advised to call back or seek an in-person evaluation if the symptoms worsen or if the condition fails to improve as anticipated.  I discussed the assessment and treatment plan with the patient. The patient was provided an opportunity to ask questions and all were answered. The patient agreed with the plan and demonstrated an understanding of the instructions.  The entirety of the information documented in the History of Present Illness, Review of Systems and Physical Exam were personally obtained by me. Portions of this information were initially documented by the CMA and reviewed by me for thoroughness and accuracy.  [name], [facility], [place] [phone] (phone) [phone] (fax)

## 2022-01-01 NOTE — Progress Notes (Deleted)
      Established patient visit   Patient: Curtis [name]   DOB: 1975/08/28   46 y.o. Male  MRN: [id number] Visit Date: 01/03/2022  Today's healthcare provider: [name], PA-C   No chief complaint on file.  Subjective    HPI    Medications: Outpatient Medications Prior to Visit  Medication Sig   amLODipine (NORVASC) 5 MG tablet Take 1 tablet (5 mg total) by mouth daily.   losartan-hydrochlorothiazide (HYZAAR) 100-25 MG tablet Take 1 tablet by mouth daily.   oxyCODONE-acetaminophen (PERCOCET) 5-325 MG tablet Take 1 tablet by mouth every 4 (four) hours as needed for severe pain.   varenicline (CHANTIX) 1 MG tablet Take 1 tablet (1 mg total) by mouth 2 (two) times daily.   No facility-administered medications prior to visit.    Review of Systems  {Labs  Heme  Chem  Endocrine  Serology  Results Review (optional):23779}   Objective    There were no vitals taken for this visit. {Show previous vital signs (optional):23777}  Physical Exam  ***  No results found for any visits on 01/03/22.  Assessment & Plan     ***  No follow-ups on file.      {provider attestation***:1}   [name], Hershal Coria  [facility] [phone] (phone) [phone] (fax)  [place]

## 2022-01-03 ENCOUNTER — Ambulatory Visit: Payer: 59 | Admitting: Physician Assistant

## 2022-01-12 ENCOUNTER — Other Ambulatory Visit: Payer: Self-pay | Admitting: Physician Assistant

## 2022-01-12 DIAGNOSIS — I10 Essential (primary) hypertension: Secondary | ICD-10-CM

## 2022-01-13 NOTE — Telephone Encounter (Signed)
Requested Prescriptions  Pending Prescriptions Disp Refills   amLODipine (NORVASC) 5 MG tablet [Pharmacy Med Name: AMLODIPINE BESYLATE 5 MG TAB] 30 tablet 2    Sig: TAKE 1 TABLET (5 MG TOTAL) BY MOUTH DAILY.     Cardiovascular: Calcium Channel Blockers 2 Failed - 01/12/2022  8:59 AM      Failed - Last BP in normal range    BP Readings from Last 1 Encounters:  12/20/21 (!) 146/100         Passed - Last Heart Rate in normal range    Pulse Readings from Last 1 Encounters:  12/20/21 91         Passed - Valid encounter within last 6 months    Recent Outpatient Visits           3 weeks ago Hypertension, unspecified type   [facility] [place], [place], PA-C   2 months ago Hypertension, unspecified type   [facility] [place], [place], PA-C   3 months ago Hypertension, unspecified type   [employer], [place], PA-C   3 months ago Annual physical exam   [employer], [place], PA-C   3 months ago Morbid obesity [facility])   [facility] [place], [place], [place]

## 2022-03-11 ENCOUNTER — Ambulatory Visit (INDEPENDENT_AMBULATORY_CARE_PROVIDER_SITE_OTHER): Payer: 59 | Admitting: Podiatry

## 2022-03-11 DIAGNOSIS — Z91199 Patient's noncompliance with other medical treatment and regimen due to unspecified reason: Secondary | ICD-10-CM

## 2022-03-12 NOTE — Progress Notes (Signed)
Patient was no-show for appointment today 

## 2022-04-08 ENCOUNTER — Emergency Department
Admission: EM | Admit: 2022-04-08 | Discharge: 2022-04-08 | Disposition: A | Discharge status: Home / Self Care | Payer: 59 | Attending: Emergency Medicine | Admitting: Emergency Medicine

## 2022-04-08 DIAGNOSIS — X509XXA Other and unspecified overexertion or strenuous movements or postures, initial encounter: Secondary | ICD-10-CM | POA: Diagnosis not present

## 2022-04-08 DIAGNOSIS — S86212A Strain of muscle(s) and tendon(s) of anterior muscle group at lower leg level, left leg, initial encounter: Secondary | ICD-10-CM | POA: Diagnosis not present

## 2022-04-08 DIAGNOSIS — S86812A Strain of other muscle(s) and tendon(s) at lower leg level, left leg, initial encounter: Secondary | ICD-10-CM

## 2022-04-08 DIAGNOSIS — M79662 Pain in left lower leg: Secondary | ICD-10-CM | POA: Diagnosis present

## 2022-04-08 MED ORDER — KETOROLAC TROMETHAMINE 10 MG PO TABS: 10.0000 mg | ORAL_TABLET | Freq: Four times a day (QID) | ORAL | 0 refills | Status: DC | PRN

## 2022-04-08 MED ORDER — KETOROLAC TROMETHAMINE 30 MG/ML IJ SOLN
30.0000 mg | Freq: Once | INTRAMUSCULAR | Status: AC
  Administered 2022-04-08: 30 mg via INTRAMUSCULAR
  Filled 2022-04-08: qty 1

## 2022-04-08 NOTE — ED Triage Notes (Signed)
Pt sts that he has a really sharp pain in his left calf. Pt sts that he heard something go "pow" in his left leg and since than he has been in pain. Pt is ambulatory with a limp.
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
## 2022-04-08 NOTE — ED Provider Notes (Signed)
[facility] Provider Note  Patient Contact: 8:01 PM (approximate)   History   Leg Pain   HPI  Curtis [name] is a 47 y.o. male who presents emergency department for evaluation of calf pain.  Patient has tenderness/pain in his left calf.  Patient states that he was mowing the grass, stepped backwards and felt a pulling sensation in the calf.  He still able to dorsiflex and plantarflex.  He states that he does have pain to the area but no deformity, no ecchymosis.  No history of previous injuries to this leg.     Physical Exam   Triage Vital Signs: ED Triage Vitals  Enc Vitals Group     BP 04/08/22 1832 (!) 168/113     Pulse Rate 04/08/22 1831 96     Resp 04/08/22 1831 17     Temp 04/08/22 1831 98.2 F (36.8 C)     Temp Source 04/08/22 1831 Oral     SpO2 04/08/22 1831 98 %     Weight 04/08/22 1832 (!) 310 lb (140.6 kg)     Height --      Head Circumference --      Peak Flow --      Pain Score 04/08/22 1831 8     Pain Loc --      Pain Edu? --      Excl. in [place]? --     Most recent vital signs: Vitals:   04/08/22 1858 04/08/22 1901  BP:  (!) 161/99  Pulse:    Resp:    Temp:    SpO2: 99%      General: Alert and in no acute distress.  Cardiovascular:  Good peripheral perfusion Respiratory: Normal respiratory effort without tachypnea or retractions. Lungs CTAB.  Musculoskeletal: Full range of motion to all extremities.  Visual patient in the left calf reveals no edema, erythema, ecchymosis.  He is tender along the medial calf.  No tenderness over the Achilles tendon.  No palpable abnormality or deficits.  Patient has full range of motion to the knee, ankle and foot.  Pulse and sensation intact distally. Neurologic:  No gross focal neurologic deficits are appreciated.  Skin:   No rash noted Other:   ED Results / Procedures / Treatments   Labs (all labs ordered are listed, but only abnormal results are displayed) Labs Reviewed - No data  to display   EKG     RADIOLOGY    No results found.  PROCEDURES:  Critical Care performed: No  Procedures   MEDICATIONS ORDERED IN ED: Medications  ketorolac (TORADOL) 30 MG/ML injection 30 mg (has no administration in time range)     IMPRESSION / MDM / ASSESSMENT AND PLAN / ED COURSE  I reviewed the triage vital signs and the nursing notes.                                 Differential diagnosis includes, but is not limited to, calf strain, gastrocnemius tear, Achilles tendon rupture  Patient's presentation is most consistent with acute presentation with potential threat to life or bodily function.   Patient's diagnosis is consistent with calf strain.  Patient presents emergency department with pain in his calf.  Patient was mowing his grass, stepped awkwardly in a ditch and felt a pulling sensation in his leg.  Ongoing pain through the day and tender to palpation along the medial calf.  There  is no palpable abnormality.  No overlying edema, erythema or ecchymosis.  Achilles tendon is palpably intact.  At this time we will treat with anti-inflammatory.  Follow-up with orthopedics if symptoms or not improving with conservative measures..  Patient is given ED precautions to return to the ED for any worsening or new symptoms.     FINAL CLINICAL IMPRESSION(S) / ED DIAGNOSES   Final diagnoses:  Strain of calf muscle, left, initial encounter     Rx / DC Orders   ED Discharge Orders          Ordered    ketorolac (TORADOL) 10 MG tablet  Every 6 hours PRN        04/08/22 2003             Note:  This document was prepared using Dragon voice recognition software and may include unintentional dictation errors.   Brynda Peon 04/08/22 2006    Lavonia Drafts, MD 04/08/22 2015

## 2022-04-08 NOTE — ED Notes (Signed)
Pt verbalizes understanding of discharge instructions. Opportunity for questioning and answers were provided. Pt discharged from ED to home.   ? ?

## 2022-08-20 DIAGNOSIS — Z833 Family history of diabetes mellitus: Secondary | ICD-10-CM | POA: Diagnosis not present

## 2022-08-20 DIAGNOSIS — Z6841 Body Mass Index (BMI) 40.0 and over, adult: Secondary | ICD-10-CM | POA: Diagnosis not present

## 2022-08-20 DIAGNOSIS — I10 Essential (primary) hypertension: Secondary | ICD-10-CM | POA: Diagnosis not present

## 2022-08-20 DIAGNOSIS — Z720 Tobacco use: Secondary | ICD-10-CM | POA: Diagnosis not present

## 2022-08-20 DIAGNOSIS — Z823 Family history of stroke: Secondary | ICD-10-CM | POA: Diagnosis not present

## 2022-08-20 DIAGNOSIS — I951 Orthostatic hypotension: Secondary | ICD-10-CM | POA: Diagnosis not present

## 2022-08-20 DIAGNOSIS — Z8249 Family history of ischemic heart disease and other diseases of the circulatory system: Secondary | ICD-10-CM | POA: Diagnosis not present

## 2022-08-21 ENCOUNTER — Other Ambulatory Visit: Payer: Self-pay

## 2022-08-21 ENCOUNTER — Emergency Department
Admission: EM | Admit: 2022-08-21 | Discharge: 2022-08-21 | Disposition: A | Discharge status: Home / Self Care | Payer: 59 | Attending: Emergency Medicine | Admitting: Emergency Medicine

## 2022-08-21 DIAGNOSIS — I10 Essential (primary) hypertension: Secondary | ICD-10-CM | POA: Diagnosis not present

## 2022-08-21 DIAGNOSIS — X58XXXA Exposure to other specified factors, initial encounter: Secondary | ICD-10-CM | POA: Insufficient documentation

## 2022-08-21 DIAGNOSIS — S3991XA Unspecified injury of abdomen, initial encounter: Secondary | ICD-10-CM | POA: Diagnosis present

## 2022-08-21 DIAGNOSIS — S39011A Strain of muscle, fascia and tendon of abdomen, initial encounter: Secondary | ICD-10-CM | POA: Insufficient documentation

## 2022-08-21 DIAGNOSIS — T148XXA Other injury of unspecified body region, initial encounter: Secondary | ICD-10-CM

## 2022-08-21 NOTE — ED Triage Notes (Addendum)
Pt sts that he is having groin pain that runs down his left leg. Pt sts that he went to bed fine and woke up with the pain. Pt denies any testicular pain.

## 2022-08-21 NOTE — Discharge Instructions (Signed)
I believe you have a muscle strain.  You can take 650 mg of Tylenol and 600 mg of ibuprofen every 6 hours as needed for pain.  You can also apply topical pain relievers like IcyHot and Biofreeze.  You could use ice and heat.  Please return to the ED with any new or worsening symptoms.

## 2022-08-21 NOTE — ED Provider Notes (Signed)
   [facility] Provider Note    Event Date/Time   First MD Initiated Contact with Patient 08/21/22 1502     (approximate)   History   Groin Pain   HPI  Curtis [name] is a 47 y.o. male PMH of hypertension who presents for evaluation of groin pain.  Patient states the pain began this morning and starts in his groin and travels medially down his thigh to his knee.  Patient does not recall a specific injury, trauma or fall.  He denies testicular pain.  He denies numbness or weakness in his left leg.      Physical Exam   Triage Vital Signs: ED Triage Vitals  Encounter Vitals Group     BP 08/21/22 1457 (!) 163/102     Systolic BP Percentile --      Diastolic BP Percentile --      Pulse Rate 08/21/22 1457 (!) 108     Resp 08/21/22 1457 19     Temp 08/21/22 1457 98.1 F (36.7 C)     Temp Source 08/21/22 1457 Oral     SpO2 08/21/22 1457 98 %     Weight 08/21/22 1459 (!) 305 lb (138.3 kg)     Height 08/21/22 1459 6\' 1"  (1.854 m)     Head Circumference --      Peak Flow --      Pain Score 08/21/22 1458 7     Pain Loc --      Pain Education --      Exclude from Growth Chart --     Most recent vital signs: Vitals:   08/21/22 1457  BP: (!) 163/102  Pulse: (!) 108  Resp: 19  Temp: 98.1 F (36.7 C)  SpO2: 98%     General: Awake, no distress.  CV:  Good peripheral perfusion.  Resp:  Normal effort.  Abd:  No distention.  Other:  Tender to palpation along the left medial thigh.   ED Results / Procedures / Treatments   Labs (all labs ordered are listed, but only abnormal results are displayed) Labs Reviewed - No data to display   PROCEDURES:  Critical Care performed: No  Procedures   MEDICATIONS ORDERED IN ED: Medications - No data to display   IMPRESSION / MDM / ASSESSMENT AND PLAN / ED COURSE  I reviewed the triage vital signs and the nursing notes.                              Differential diagnosis includes, but is not  limited to, muscle strain, pes anserine bursitis or hernia.  Patient's presentation is most consistent with acute, uncomplicated illness.  Patient's diagnosis is most consistent with a muscle strain.  I advised him to take ibuprofen and Tylenol as needed for pain.  He can also use heat and ice as well as topical pain relievers if he would like.  He was agreeable to plan, voiced understanding and was stable discharge.    FINAL CLINICAL IMPRESSION(S) / ED DIAGNOSES   Final diagnoses:  Muscle strain     Rx / DC Orders   ED Discharge Orders     None        Note:  This document was prepared using Dragon voice recognition software and may include unintentional dictation errors.   Cameron Ali, PA-C 08/21/22 1527    Corena Herter, MD 08/22/22 1426

## 2022-08-22 ENCOUNTER — Telehealth: Payer: Self-pay | Admitting: Physician Assistant

## 2022-08-22 ENCOUNTER — Ambulatory Visit: Payer: 59 | Admitting: Physician Assistant

## 2022-08-22 NOTE — Telephone Encounter (Signed)
Called pt to reschedule appt due to weather conditions,no answer, no VM set up, no MYCHART.( [facility]EC reschedule appt, thank you)

## 2022-08-22 NOTE — Progress Notes (Deleted)
  Established patient visit  Patient: Curtis [name]   DOB: May 04, 1975   47 y.o. Male  MRN: [id number] Visit Date: 08/22/2022  Today's healthcare provider: Debera Lat, PA-C   No chief complaint on file.  Subjective    HPI  *** Discussed the use of AI scribe software for clinical note transcription with the patient, who gave verbal consent to proceed.  History of Present Illness               10/03/2021   10:11 AM 09/05/2021    8:51 AM  Depression screen PHQ 2/9  Decreased Interest 0 0  Down, Depressed, Hopeless 0 0  PHQ - 2 Score 0 0  Altered sleeping 0 0  Tired, decreased energy 0 0  Change in appetite 0 0  Feeling bad or failure about yourself  0 0  Trouble concentrating 0 0  Moving slowly or fidgety/restless 0 0  Suicidal thoughts 0 0  PHQ-9 Score 0 0  Difficult doing work/chores Not difficult at all Not difficult at all       No data to display          Medications: Outpatient Medications Prior to Visit  Medication Sig   amLODipine (NORVASC) 5 MG tablet TAKE 1 TABLET (5 MG TOTAL) BY MOUTH DAILY.   ketorolac (TORADOL) 10 MG tablet Take 1 tablet (10 mg total) by mouth every 6 (six) hours as needed.   losartan-hydrochlorothiazide (HYZAAR) 100-25 MG tablet Take 1 tablet by mouth daily.   oxyCODONE-acetaminophen (PERCOCET) 5-325 MG tablet Take 1 tablet by mouth every 4 (four) hours as needed for severe pain.   varenicline (CHANTIX) 1 MG tablet Take 1 tablet (1 mg total) by mouth 2 (two) times daily.   No facility-administered medications prior to visit.    Review of Systems Except see HPI   {Insert previous labs (optional):23779} {See past labs  Heme  Chem  Endocrine  Serology  Results Review (optional):1}   Objective    There were no vitals taken for this visit. {Insert last BP/Wt (optional):23777}{See vitals history (optional):1}   Physical Exam   No results found for any visits on 08/22/22.  Assessment & Plan    *** Assessment and  Plan              No follow-ups on file.      [facility]

## 2022-08-24 NOTE — Progress Notes (Unsigned)
Established patient visit  Patient: Curtis [name]   DOB: Sep 05, 1975   47 y.o. Male  MRN: [id number] Visit Date: 08/27/2022  Today's healthcare provider: Debera Lat, PA-C   No chief complaint on file.  Subjective    HPI  *** Discussed the use of AI scribe software for clinical note transcription with the patient, who gave verbal consent to proceed.  History of Present Illness               10/03/2021   10:11 AM 09/05/2021    8:51 AM  Depression screen PHQ 2/9  Decreased Interest 0 0  Down, Depressed, Hopeless 0 0  PHQ - 2 Score 0 0  Altered sleeping 0 0  Tired, decreased energy 0 0  Change in appetite 0 0  Feeling bad or failure about yourself  0 0  Trouble concentrating 0 0  Moving slowly or fidgety/restless 0 0  Suicidal thoughts 0 0  PHQ-9 Score 0 0  Difficult doing work/chores Not difficult at all Not difficult at all       No data to display          Medications: Outpatient Medications Prior to Visit  Medication Sig   amLODipine (NORVASC) 5 MG tablet TAKE 1 TABLET (5 MG TOTAL) BY MOUTH DAILY.   ketorolac (TORADOL) 10 MG tablet Take 1 tablet (10 mg total) by mouth every 6 (six) hours as needed.   losartan-hydrochlorothiazide (HYZAAR) 100-25 MG tablet Take 1 tablet by mouth daily.   oxyCODONE-acetaminophen (PERCOCET) 5-325 MG tablet Take 1 tablet by mouth every 4 (four) hours as needed for severe pain.   varenicline (CHANTIX) 1 MG tablet Take 1 tablet (1 mg total) by mouth 2 (two) times daily.   No facility-administered medications prior to visit.    Review of Systems  All other systems reviewed and are negative.  Except see HPI   {Insert previous labs (optional):23779} {See past labs  Heme  Chem  Endocrine  Serology  Results Review (optional):1}   Objective    There were no vitals taken for this visit. {Insert last BP/Wt (optional):23777}{See vitals history (optional):1}   Physical Exam Vitals reviewed.  Constitutional:       General: He is not in acute distress.    Appearance: Normal appearance. He is not diaphoretic.  HENT:     Head: Normocephalic and atraumatic.  Eyes:     General: No scleral icterus.    Conjunctiva/sclera: Conjunctivae normal.  Cardiovascular:     Rate and Rhythm: Normal rate and regular rhythm.     Pulses: Normal pulses.     Heart sounds: Normal heart sounds. No murmur heard. Pulmonary:     Effort: Pulmonary effort is normal. No respiratory distress.     Breath sounds: Normal breath sounds. No wheezing or rhonchi.  Musculoskeletal:     Cervical back: Neck supple.     Right lower leg: No edema.     Left lower leg: No edema.  Lymphadenopathy:     Cervical: No cervical adenopathy.  Skin:    General: Skin is warm and dry.     Findings: No rash.  Neurological:     Mental Status: He is alert and oriented to person, place, and time. Mental status is at baseline.  Psychiatric:        Mood and Affect: Mood normal.        Behavior: Behavior normal.      No results found for any visits on 08/27/22.  Assessment &  Plan    ***  No follow-ups on file.     The patient was advised to call back or seek an in-person evaluation if the symptoms worsen or if the condition fails to improve as anticipated.  I discussed the assessment and treatment plan with the patient. The patient was provided an opportunity to ask questions and all were answered. The patient agreed with the plan and demonstrated an understanding of the instructions.  I, Debera Lat, PA-C have reviewed all documentation for this visit. The documentation on  08/27/22 for the exam, diagnosis, procedures, and orders are all accurate and complete.  Debera Lat, [facility], MMS [facility] [phone] (phone) [phone] (fax)  [facility]

## 2022-08-27 ENCOUNTER — Encounter: Payer: Self-pay | Admitting: Physician Assistant

## 2022-08-27 ENCOUNTER — Ambulatory Visit: Payer: 59 | Admitting: Physician Assistant

## 2022-08-27 VITALS — BP 162/101 | HR 90 | Ht 73.0 in | Wt 311.1 lb

## 2022-08-27 DIAGNOSIS — F17200 Nicotine dependence, unspecified, uncomplicated: Secondary | ICD-10-CM | POA: Diagnosis not present

## 2022-08-27 DIAGNOSIS — I10 Essential (primary) hypertension: Secondary | ICD-10-CM | POA: Diagnosis not present

## 2022-08-27 DIAGNOSIS — M7989 Other specified soft tissue disorders: Secondary | ICD-10-CM

## 2022-08-27 MED ORDER — AMLODIPINE BESYLATE 5 MG PO TABS: 5.0000 mg | ORAL_TABLET | Freq: Every day | ORAL | 0 refills | Status: DC

## 2022-08-27 MED ORDER — LOSARTAN POTASSIUM-HCTZ 100-25 MG PO TABS: 1.0000 | ORAL_TABLET | Freq: Every day | ORAL | 0 refills | Status: DC

## 2022-09-10 ENCOUNTER — Ambulatory Visit: Payer: 59 | Admitting: Physician Assistant

## 2022-09-10 ENCOUNTER — Other Ambulatory Visit: Payer: Self-pay | Admitting: Physician Assistant

## 2022-09-10 DIAGNOSIS — F17200 Nicotine dependence, unspecified, uncomplicated: Secondary | ICD-10-CM

## 2022-09-10 NOTE — Progress Notes (Deleted)
Established patient visit  Patient: Curtis [name]   DOB: Jul 11, 1975   47 y.o. Male  MRN: [id number] Visit Date: 09/10/2022  Today's healthcare provider: Debera Lat, PA-C   No chief complaint on file.  Subjective    HPI  *** Discussed the use of AI scribe software for clinical note transcription with the patient, who gave verbal consent to proceed.  History of Present Illness               10/03/2021   10:11 AM 09/05/2021    8:51 AM  Depression screen PHQ 2/9  Decreased Interest 0 0  Down, Depressed, Hopeless 0 0  PHQ - 2 Score 0 0  Altered sleeping 0 0  Tired, decreased energy 0 0  Change in appetite 0 0  Feeling bad or failure about yourself  0 0  Trouble concentrating 0 0  Moving slowly or fidgety/restless 0 0  Suicidal thoughts 0 0  PHQ-9 Score 0 0  Difficult doing work/chores Not difficult at all Not difficult at all       No data to display          Medications: Outpatient Medications Prior to Visit  Medication Sig   amLODipine (NORVASC) 5 MG tablet Take 1 tablet (5 mg total) by mouth daily.   ketorolac (TORADOL) 10 MG tablet Take 1 tablet (10 mg total) by mouth every 6 (six) hours as needed.   losartan-hydrochlorothiazide (HYZAAR) 100-25 MG tablet Take 1 tablet by mouth daily.   oxyCODONE-acetaminophen (PERCOCET) 5-325 MG tablet Take 1 tablet by mouth every 4 (four) hours as needed for severe pain.   varenicline (CHANTIX) 1 MG tablet Take 1 tablet (1 mg total) by mouth 2 (two) times daily.   No facility-administered medications prior to visit.    Review of Systems Except see HPI   {Insert previous labs (optional):23779} {See past labs  Heme  Chem  Endocrine  Serology  Results Review (optional):1}   Objective    There were no vitals taken for this visit. {Insert last BP/Wt (optional):23777}{See vitals history (optional):1}   Physical Exam   No results found for any visits on 09/10/22.  Assessment & Plan    *** Assessment and  Plan              No follow-ups on file.      [facility]

## 2022-09-10 NOTE — Telephone Encounter (Signed)
Medication Refill - Medication: varenicline (CHANTIX) 1 MG tablet   Has the patient contacted their pharmacy? No.  Preferred Pharmacy (with phone number or street name):  [id number] - [place], [place] - [address] Phone: [phone]  Fax: [phone]     Has the patient been seen for an appointment in the last year OR does the patient have an upcoming appointment? Yes.    Agent: Please be advised that RX refills may take up to 3 business days. We ask that you follow-up with your pharmacy.

## 2022-09-11 ENCOUNTER — Encounter: Payer: Self-pay | Admitting: Physician Assistant

## 2022-09-11 ENCOUNTER — Ambulatory Visit: Payer: 59 | Admitting: Physician Assistant

## 2022-09-11 VITALS — BP 140/96 | HR 100 | Temp 98.1°F | Ht 73.0 in | Wt 304.0 lb

## 2022-09-11 DIAGNOSIS — E119 Type 2 diabetes mellitus without complications: Secondary | ICD-10-CM

## 2022-09-11 DIAGNOSIS — E7800 Pure hypercholesterolemia, unspecified: Secondary | ICD-10-CM

## 2022-09-11 DIAGNOSIS — Z9889 Other specified postprocedural states: Secondary | ICD-10-CM | POA: Diagnosis not present

## 2022-09-11 DIAGNOSIS — M25519 Pain in unspecified shoulder: Secondary | ICD-10-CM | POA: Diagnosis not present

## 2022-09-11 MED ORDER — METFORMIN HCL 500 MG PO TABS: 500.0000 mg | ORAL_TABLET | Freq: Two times a day (BID) | ORAL | 3 refills | Status: DC

## 2022-09-11 MED ORDER — VARENICLINE TARTRATE 1 MG PO TABS: 1.0000 mg | ORAL_TABLET | Freq: Two times a day (BID) | ORAL | 1 refills | Status: DC

## 2022-09-11 MED ORDER — ATORVASTATIN CALCIUM 10 MG PO TABS: 10.0000 mg | ORAL_TABLET | Freq: Every day | ORAL | 3 refills | Status: DC

## 2022-09-11 NOTE — Telephone Encounter (Signed)
Requested Prescriptions  Pending Prescriptions Disp Refills   varenicline (CHANTIX) 1 MG tablet 56 tablet 1    Sig: Take 1 tablet (1 mg total) by mouth 2 (two) times daily.     Psychiatry:  Drug Dependence Therapy - varenicline Failed - 09/10/2022  2:41 PM      Failed - Manual Review: Do not refill starter pack. 1 mg tabs may be extended up to one year if the patient has quit smoking but still feels at risk for relapse.      Passed - Cr in normal range and within 180 days    Creatinine  Date Value Ref Range Status  11/17/2013 1.01 0.60 - 1.30 mg/dL Final   Creatinine, Ser  Date Value Ref Range Status  08/27/2022 1.04 0.76 - 1.27 mg/dL Final         Passed - Completed PHQ-2 or PHQ-9 in the last 360 days      Passed - Valid encounter within last 6 months    Recent Outpatient Visits           2 weeks ago Hypertension, unspecified type   [place] [facility] [place], [place], PA-C   8 months ago Hypertension, unspecified type   [facility] [place], [place], PA-C   10 months ago Hypertension, unspecified type   [facility] [place], [place], PA-C   11 months ago Hypertension, unspecified type   [facility] [place], [place], PA-C   11 months ago Annual physical exam   [place] [facility] [place], [place], PA-C       Future Appointments             Today Debera Lat, PA-C [place] [facility], [place]

## 2022-09-11 NOTE — Progress Notes (Addendum)
Established patient visit  Patient: Curtis [name]   DOB: 12/20/1975   47 y.o. Male  MRN: [id number] Visit Date: 09/11/2022  Today's healthcare provider: Debera Lat, PA-C   Chief Complaint  Patient presents with   Follow-up   Hypertension Patient states was given only 1 blood pressure pill at pharmacy and he thinks? Its the Losartan but not sure    Subjective      The 10-year ASCVD risk score (Arnett DK, et al., 2019) is: 24.8%  Discussed the use of AI scribe software for clinical note transcription with the patient, who gave verbal consent to proceed.  History of Present Illness   The patient, with a history of hypertension, presents with concerns about his blood pressure which has been consistently high, even with medication. The patient admits to not being fully compliant with the prescribed regimen, taking only one of the two prescribed medications. He reports experiencing stress related to family issues, which he believes may be contributing to his elevated blood pressure. The patient also mentions a shoulder issue causing discomfort, but it does not seem to be his primary concern at this time.  In addition to hypertension, the patient has been diagnosed with diabetes with a recent HbA1c of 6.7. The patient admits to a diet that includes salty and spicy foods, and acknowledges the need for dietary changes.  The patient also has high cholesterol, with The 10-year ASCVD risk score (Arnett DK, et al., 2019) is: 24.8%        10/03/2021   10:11 AM 09/05/2021    8:51 AM  Depression screen PHQ 2/9  Decreased Interest 0 0  Down, Depressed, Hopeless 0 0  PHQ - 2 Score 0 0  Altered sleeping 0 0  Tired, decreased energy 0 0  Change in appetite 0 0  Feeling bad or failure about yourself  0 0  Trouble concentrating 0 0  Moving slowly or fidgety/restless 0 0  Suicidal thoughts 0 0  PHQ-9 Score 0 0  Difficult doing work/chores Not difficult at all Not difficult at all     Medications: Outpatient Medications Prior to Visit  Medication Sig   losartan-hydrochlorothiazide (HYZAAR) 100-25 MG tablet Take 1 tablet by mouth daily.   amLODipine (NORVASC) 5 MG tablet Take 1 tablet (5 mg total) by mouth daily. (Patient not taking: Reported on 09/11/2022)   ketorolac (TORADOL) 10 MG tablet Take 1 tablet (10 mg total) by mouth every 6 (six) hours as needed. (Patient not taking: Reported on 09/11/2022)   oxyCODONE-acetaminophen (PERCOCET) 5-325 MG tablet Take 1 tablet by mouth every 4 (four) hours as needed for severe pain. (Patient not taking: Reported on 09/11/2022)   varenicline (CHANTIX) 1 MG tablet Take 1 tablet (1 mg total) by mouth 2 (two) times daily. (Patient not taking: Reported on 09/11/2022)   No facility-administered medications prior to visit.    Review of Systems  All other systems reviewed and are negative.  Except see HPI       Objective    BP (!) 140/96   Pulse 100   Temp 98.1 F (36.7 C)   Ht 6\' 1"  (1.854 m)   Wt (!) 304 lb (137.9 kg)   SpO2 99%   BMI 40.11 kg/m     Physical Exam Vitals reviewed.  Constitutional:      General: He is not in acute distress.    Appearance: Normal appearance. He is not diaphoretic.  HENT:     Head: Normocephalic and atraumatic.  Eyes:  General: No scleral icterus.    Conjunctiva/sclera: Conjunctivae normal.  Cardiovascular:     Rate and Rhythm: Normal rate and regular rhythm.     Pulses: Normal pulses.     Heart sounds: Normal heart sounds. No murmur heard. Pulmonary:     Effort: Pulmonary effort is normal. No respiratory distress.     Breath sounds: Normal breath sounds. No wheezing or rhonchi.  Musculoskeletal:     Cervical back: Neck supple.     Right lower leg: No edema.     Left lower leg: No edema.  Lymphadenopathy:     Cervical: No cervical adenopathy.  Skin:    General: Skin is warm and dry.     Findings: No rash.  Neurological:     Mental Status: He is alert and oriented to  person, place, and time. Mental status is at baseline.  Psychiatric:        Behavior: Behavior normal.        Thought Content: Thought content normal.        Judgment: Judgment normal.      No results found for any visits on 09/11/22.  Assessment & Plan        Hypertension Elevated blood pressure readings at home and in clinic. Patient has been taking Amlodipine 5 mcg but has not been taking Hyzaar due to pharmacy error. Discussed the importance of consistent medication use and the risk of stroke with uncontrolled hypertension. -Continue Amlodipine 5 mcg daily. Questioning compliance. Per medic dispense hx, it was dispense on 09/10/22  We went through all of his medication list -Continue Hyzaar daily. -Check blood pressure at home and bring readings to next appointment. -Consider referral to hypertensive clinic if blood pressure remains uncontrolled after 2 weeks. Advised to bring BP log and his BP devise  Diabetes Mellitus Most recent A1c of 6.7. Discussed the importance of diet and exercise in managing diabetes. -Agreed to Start taking Metformin, beginning with one tablet daily and increasing to two tablets daily if tolerated. -Consider adding Ozempic or Wegovy if covered by insurance. -Consider referral to a nutritionist.  Hyperlipidemia LDL cholesterol of 92, which is above the target of 70 for patients with diabetes. Discussed the importance of diet and exercise in managing cholesterol levels. -Start cholesterol-lowering medication/atorvastatin  Shoulder Pain Patient reports tightness in the shoulder. No acute injury reported. -Continue ibuprofen and Tylenol as needed. -Apply heat or ice as needed. -Perform stretching exercises. -Consider referral to physical therapy.  General Health Maintenance / Followup Plans -Encourage low carb, low salt, low spice diet. -Encourage regular exercise and weight loss. -Check thyroid function at next visit due to low normal thyroid  hormone level. -Follow up in 2 weeks to reassess blood pressure, blood sugar, and cholesterol management. -Consider bringing a family member to the next visit for support and to help with understanding and managing health conditions.         Return in about 2 weeks (around 09/25/2022) for BP f/u.      [facility]

## 2022-09-27 ENCOUNTER — Ambulatory Visit: Payer: 59 | Admitting: Physician Assistant

## 2022-10-01 ENCOUNTER — Other Ambulatory Visit: Payer: Self-pay | Admitting: Physician Assistant

## 2022-10-01 DIAGNOSIS — F17200 Nicotine dependence, unspecified, uncomplicated: Secondary | ICD-10-CM

## 2022-10-02 ENCOUNTER — Ambulatory Visit: Payer: 59 | Admitting: Physician Assistant

## 2022-11-22 ENCOUNTER — Other Ambulatory Visit: Payer: Self-pay | Admitting: Physician Assistant

## 2022-11-22 DIAGNOSIS — I10 Essential (primary) hypertension: Secondary | ICD-10-CM

## 2022-11-22 NOTE — Telephone Encounter (Signed)
Requested Prescriptions  Pending Prescriptions Disp Refills   losartan-hydrochlorothiazide (HYZAAR) 100-25 MG tablet [Pharmacy Med Name: LOSARTAN-HCTZ 100-25 MG TAB] 90 tablet 0    Sig: TAKE 1 TABLET BY MOUTH EVERY DAY     Cardiovascular: ARB + Diuretic Combos Failed - 11/22/2022  1:30 AM      Failed - Last BP in normal range    BP Readings from Last 1 Encounters:  09/11/22 (!) 140/96         Passed - K in normal range and within 180 days    Potassium  Date Value Ref Range Status  08/27/2022 4.5 3.5 - 5.2 mmol/L Final  11/17/2013 3.4 (L) 3.5 - 5.1 mmol/L Final         Passed - Na in normal range and within 180 days    Sodium  Date Value Ref Range Status  08/27/2022 142 134 - 144 mmol/L Final  11/17/2013 137 136 - 145 mmol/L Final         Passed - Cr in normal range and within 180 days    Creatinine  Date Value Ref Range Status  11/17/2013 1.01 0.60 - 1.30 mg/dL Final   Creatinine, Ser  Date Value Ref Range Status  08/27/2022 1.04 0.76 - 1.27 mg/dL Final         Passed - eGFR is 10 or above and within 180 days    EGFR (African American)  Date Value Ref Range Status  11/17/2013 >60 >12mL/min Final   GFR calc Af Amer  Date Value Ref Range Status  02/16/2018 >60 >60 mL/min Final   EGFR (Non-African Amer.)  Date Value Ref Range Status  11/17/2013 >60 >83mL/min Final    Comment:    eGFR values <17mL/min/1.73 m2 may be an indication of chronic kidney disease (CKD). Calculated eGFR, using the MRDR Study equation, is useful in  patients with stable renal function. The eGFR calculation will not be reliable in acutely ill patients when serum creatinine is changing rapidly. It is not useful in patients on dialysis. The eGFR calculation may not be applicable to patients at the low and high extremes of body sizes, pregnant women, and vegetarians.    GFR calc non Af Amer  Date Value Ref Range Status  02/16/2018 >60 >60 mL/min Final   eGFR  Date Value Ref Range Status   08/27/2022 89 >59 mL/min/1.73 Final         Passed - Patient is not pregnant      Passed - Valid encounter within last 6 months    Recent Outpatient Visits           2 months ago Diabetes mellitus without complication [facility])   [place] [facility] [place], [place], PA-C   2 months ago Hypertension, unspecified type   [place] [facility] [place], [place], PA-C   11 months ago Hypertension, unspecified type    [place] [facility] [place], [place], PA-C   1 year ago Hypertension, unspecified type   [facility] [place], [place], PA-C   1 year ago Hypertension, unspecified type   [facility] [place], [place], [place]

## 2022-12-08 ENCOUNTER — Other Ambulatory Visit: Payer: Self-pay | Admitting: Physician Assistant

## 2022-12-08 DIAGNOSIS — I10 Essential (primary) hypertension: Secondary | ICD-10-CM

## 2022-12-10 NOTE — Telephone Encounter (Signed)
Requested Prescriptions  Pending Prescriptions Disp Refills   amLODipine (NORVASC) 5 MG tablet [Pharmacy Med Name: AMLODIPINE BESYLATE 5 MG TAB] 90 tablet 0    Sig: TAKE 1 TABLET (5 MG TOTAL) BY MOUTH DAILY.     Cardiovascular: Calcium Channel Blockers 2 Failed - 12/08/2022  8:49 AM      Failed - Last BP in normal range    BP Readings from Last 1 Encounters:  09/11/22 (!) 140/96         Passed - Last Heart Rate in normal range    Pulse Readings from Last 1 Encounters:  09/11/22 100         Passed - Valid encounter within last 6 months    Recent Outpatient Visits           3 months ago Diabetes mellitus without complication [facility])   [place] [facility] [place], [place], PA-C   3 months ago Hypertension, unspecified type   [facility] [place], [place], PA-C   11 months ago Hypertension, unspecified type   [facility] [place], [place], PA-C   1 year ago Hypertension, unspecified type   [facility] [place], [place], PA-C   1 year ago Hypertension, unspecified type   [facility] [place], [place], [place]

## 2023-01-15 ENCOUNTER — Encounter: Payer: Self-pay | Admitting: *Deleted

## 2023-01-15 ENCOUNTER — Other Ambulatory Visit: Payer: Self-pay

## 2023-01-15 ENCOUNTER — Emergency Department: Payer: 59

## 2023-01-15 DIAGNOSIS — F17210 Nicotine dependence, cigarettes, uncomplicated: Secondary | ICD-10-CM | POA: Diagnosis not present

## 2023-01-15 DIAGNOSIS — J4489 Other specified chronic obstructive pulmonary disease: Secondary | ICD-10-CM | POA: Insufficient documentation

## 2023-01-15 DIAGNOSIS — Z79899 Other long term (current) drug therapy: Secondary | ICD-10-CM | POA: Insufficient documentation

## 2023-01-15 DIAGNOSIS — E785 Hyperlipidemia, unspecified: Secondary | ICD-10-CM | POA: Diagnosis not present

## 2023-01-15 DIAGNOSIS — Z6841 Body Mass Index (BMI) 40.0 and over, adult: Secondary | ICD-10-CM | POA: Insufficient documentation

## 2023-01-15 DIAGNOSIS — R6510 Systemic inflammatory response syndrome (SIRS) of non-infectious origin without acute organ dysfunction: Secondary | ICD-10-CM | POA: Diagnosis not present

## 2023-01-15 DIAGNOSIS — Z7951 Long term (current) use of inhaled steroids: Secondary | ICD-10-CM | POA: Insufficient documentation

## 2023-01-15 DIAGNOSIS — I2510 Atherosclerotic heart disease of native coronary artery without angina pectoris: Secondary | ICD-10-CM | POA: Diagnosis not present

## 2023-01-15 DIAGNOSIS — E119 Type 2 diabetes mellitus without complications: Secondary | ICD-10-CM | POA: Insufficient documentation

## 2023-01-15 DIAGNOSIS — Z20822 Contact with and (suspected) exposure to covid-19: Secondary | ICD-10-CM | POA: Insufficient documentation

## 2023-01-15 DIAGNOSIS — I10 Essential (primary) hypertension: Secondary | ICD-10-CM | POA: Insufficient documentation

## 2023-01-15 DIAGNOSIS — R0602 Shortness of breath: Secondary | ICD-10-CM | POA: Diagnosis present

## 2023-01-15 DIAGNOSIS — J9801 Acute bronchospasm: Principal | ICD-10-CM | POA: Insufficient documentation

## 2023-01-15 DIAGNOSIS — B974 Respiratory syncytial virus as the cause of diseases classified elsewhere: Secondary | ICD-10-CM | POA: Diagnosis not present

## 2023-01-15 DIAGNOSIS — Z7984 Long term (current) use of oral hypoglycemic drugs: Secondary | ICD-10-CM | POA: Diagnosis not present

## 2023-01-15 LAB — BASIC METABOLIC PANEL
Anion gap: 13 (ref 5–15)
BUN: 16 mg/dL (ref 6–20)
CO2: 25 mmol/L (ref 22–32)
Calcium: 9 mg/dL (ref 8.9–10.3)
Chloride: 101 mmol/L (ref 98–111)
Creatinine, Ser: 0.94 mg/dL (ref 0.61–1.24)
GFR, Estimated: >60 mL/min (ref >60)
Glucose, Bld: 134 mg/dL — ABNORMAL HIGH (ref 70–99)
Potassium: 3.9 mmol/L (ref 3.5–5.1)
Sodium: 139 mmol/L (ref 135–145)

## 2023-01-15 LAB — CBC
HCT: 41.5 % (ref 39.0–52.0)
Hemoglobin: 13.5 g/dL (ref 13.0–17.0)
MCH: 27.9 pg (ref 26.0–34.0)
MCHC: 32.5 g/dL (ref 30.0–36.0)
MCV: 85.7 fL (ref 80.0–100.0)
Platelets: 279 K/uL (ref 150–400)
RBC: 4.84 MIL/uL (ref 4.22–5.81)
RDW: 16 % — ABNORMAL HIGH (ref 11.5–15.5)
WBC: 15.4 K/uL — ABNORMAL HIGH (ref 4.0–10.5)
nRBC: 0 % (ref 0.0–0.2)

## 2023-01-15 LAB — TROPONIN I (HIGH SENSITIVITY): Troponin I (High Sensitivity): 23 ng/L — ABNORMAL HIGH (ref <18)

## 2023-01-15 NOTE — ED Triage Notes (Signed)
 Pt ambulatory to triage.  Pt reports dx with bronchitis   today, pt has chest heaviness, sob and pain in chest.   Pt reports a cough   pt alert  speech clear.

## 2023-01-16 ENCOUNTER — Observation Stay
Admission: EM | Admit: 2023-01-16 | Discharge: 2023-01-17 | Disposition: A | Discharge status: Home / Self Care | Payer: 59 | Attending: Student | Admitting: Student

## 2023-01-16 DIAGNOSIS — R0789 Other chest pain: Secondary | ICD-10-CM

## 2023-01-16 DIAGNOSIS — J209 Acute bronchitis, unspecified: Secondary | ICD-10-CM | POA: Diagnosis not present

## 2023-01-16 DIAGNOSIS — J45909 Unspecified asthma, uncomplicated: Secondary | ICD-10-CM | POA: Diagnosis present

## 2023-01-16 DIAGNOSIS — J9801 Acute bronchospasm: Principal | ICD-10-CM

## 2023-01-16 LAB — TROPONIN I (HIGH SENSITIVITY): Troponin I (High Sensitivity): 22 ng/L — ABNORMAL HIGH (ref <18)

## 2023-01-16 LAB — RESPIRATORY PANEL BY PCR

## 2023-01-16 LAB — RESP PANEL BY RT-PCR (RSV, FLU A&B, COVID)  RVPGX2
Influenza A by PCR: NEGATIVE
Influenza B by PCR: NEGATIVE
Resp Syncytial Virus by PCR: POSITIVE — AB
SARS Coronavirus 2 by RT PCR: NEGATIVE

## 2023-01-16 LAB — HIV ANTIBODY (ROUTINE TESTING W REFLEX): HIV Screen 4th Generation wRfx: NONREACTIVE

## 2023-01-16 MED ORDER — ONDANSETRON HCL 4 MG/2ML IJ SOLN: 4.0000 mg | Freq: Four times a day (QID) | INTRAMUSCULAR | Status: DC | PRN

## 2023-01-16 MED ORDER — AMLODIPINE BESYLATE 5 MG PO TABS
5.0000 mg | ORAL_TABLET | Freq: Every day | ORAL | Status: DC
  Administered 2023-01-16 – 2023-01-17 (×2): 5 mg via ORAL
  Filled 2023-01-16 (×2): qty 1

## 2023-01-16 MED ORDER — LOSARTAN POTASSIUM-HCTZ 100-25 MG PO TABS: 1.0000 | ORAL_TABLET | Freq: Every day | ORAL | Status: DC

## 2023-01-16 MED ORDER — ALBUTEROL SULFATE HFA 108 (90 BASE) MCG/ACT IN AERS: 2.0000 | INHALATION_SPRAY | RESPIRATORY_TRACT | Status: DC | PRN

## 2023-01-16 MED ORDER — DOXYCYCLINE HYCLATE 100 MG PO TABS
100.0000 mg | ORAL_TABLET | Freq: Two times a day (BID) | ORAL | Status: DC
  Administered 2023-01-16 – 2023-01-17 (×3): 100 mg via ORAL
  Filled 2023-01-16 (×3): qty 1

## 2023-01-16 MED ORDER — ALBUTEROL SULFATE (2.5 MG/3ML) 0.083% IN NEBU
5.0000 mg | INHALATION_SOLUTION | Freq: Once | RESPIRATORY_TRACT | Status: AC
  Administered 2023-01-16: 5 mg via RESPIRATORY_TRACT
  Filled 2023-01-16: qty 6

## 2023-01-16 MED ORDER — METFORMIN HCL 500 MG PO TABS
500.0000 mg | ORAL_TABLET | Freq: Two times a day (BID) | ORAL | Status: DC
  Administered 2023-01-16 – 2023-01-17 (×3): 500 mg via ORAL
  Filled 2023-01-16 (×3): qty 1

## 2023-01-16 MED ORDER — IPRATROPIUM BROMIDE 0.02 % IN SOLN
0.5000 mg | Freq: Once | RESPIRATORY_TRACT | Status: AC
Start: 2023-01-16 — End: 2023-01-16
  Administered 2023-01-16: 0.5 mg via RESPIRATORY_TRACT
  Filled 2023-01-16: qty 2.5

## 2023-01-16 MED ORDER — IPRATROPIUM BROMIDE 0.02 % IN SOLN
0.5000 mg | Freq: Once | RESPIRATORY_TRACT | Status: AC
  Administered 2023-01-16: 0.5 mg via RESPIRATORY_TRACT
  Filled 2023-01-16: qty 2.5

## 2023-01-16 MED ORDER — MOMETASONE FURO-FORMOTEROL FUM 100-5 MCG/ACT IN AERO
2.0000 | INHALATION_SPRAY | Freq: Two times a day (BID) | RESPIRATORY_TRACT | Status: DC
  Filled 2023-01-16: qty 8.8

## 2023-01-16 MED ORDER — BENZONATATE 100 MG PO CAPS
100.0000 mg | ORAL_CAPSULE | Freq: Three times a day (TID) | ORAL | Status: DC
  Administered 2023-01-16 – 2023-01-17 (×4): 100 mg via ORAL
  Filled 2023-01-16 (×4): qty 1

## 2023-01-16 MED ORDER — ATORVASTATIN CALCIUM 10 MG PO TABS
10.0000 mg | ORAL_TABLET | Freq: Every day | ORAL | Status: DC
  Administered 2023-01-16 – 2023-01-17 (×2): 10 mg via ORAL
  Filled 2023-01-16 (×2): qty 1

## 2023-01-16 MED ORDER — NICOTINE 14 MG/24HR TD PT24
14.0000 mg | MEDICATED_PATCH | Freq: Every day | TRANSDERMAL | Status: DC
  Administered 2023-01-17: 14 mg via TRANSDERMAL
  Filled 2023-01-16 (×2): qty 1

## 2023-01-16 MED ORDER — IPRATROPIUM-ALBUTEROL 0.5-2.5 (3) MG/3ML IN SOLN
3.0000 mL | Freq: Once | RESPIRATORY_TRACT | Status: AC
Start: 2023-01-16 — End: 2023-01-16
  Administered 2023-01-16: 3 mL via RESPIRATORY_TRACT
  Filled 2023-01-16: qty 3

## 2023-01-16 MED ORDER — METHYLPREDNISOLONE SODIUM SUCC 125 MG IJ SOLR
125.0000 mg | Freq: Once | INTRAMUSCULAR | Status: AC
  Administered 2023-01-16: 125 mg via INTRAVENOUS
  Filled 2023-01-16: qty 2

## 2023-01-16 MED ORDER — NICOTINE 21 MG/24HR TD PT24: 21.0000 mg | MEDICATED_PATCH | Freq: Every day | TRANSDERMAL | Status: DC

## 2023-01-16 MED ORDER — ENOXAPARIN SODIUM 80 MG/0.8ML IJ SOSY
0.5000 mg/kg | PREFILLED_SYRINGE | INTRAMUSCULAR | Status: DC
  Administered 2023-01-16: 70 mg via SUBCUTANEOUS
  Filled 2023-01-16 (×2): qty 0.7

## 2023-01-16 MED ORDER — METHYLPREDNISOLONE SODIUM SUCC 40 MG IJ SOLR
40.0000 mg | Freq: Four times a day (QID) | INTRAMUSCULAR | Status: DC
  Administered 2023-01-16 – 2023-01-17 (×4): 40 mg via INTRAVENOUS
  Filled 2023-01-16 (×4): qty 1

## 2023-01-16 MED ORDER — IPRATROPIUM-ALBUTEROL 0.5-2.5 (3) MG/3ML IN SOLN
3.0000 mL | Freq: Four times a day (QID) | RESPIRATORY_TRACT | Status: DC
  Administered 2023-01-16 – 2023-01-17 (×5): 3 mL via RESPIRATORY_TRACT
  Filled 2023-01-16 (×5): qty 3

## 2023-01-16 MED ORDER — PREDNISONE 20 MG PO TABS: 40.0000 mg | ORAL_TABLET | Freq: Every day | ORAL | Status: DC

## 2023-01-16 MED ORDER — BUDESONIDE 0.25 MG/2ML IN SUSP
0.2500 mg | Freq: Two times a day (BID) | RESPIRATORY_TRACT | Status: DC
  Administered 2023-01-16 – 2023-01-17 (×3): 0.25 mg via RESPIRATORY_TRACT
  Filled 2023-01-16 (×3): qty 2

## 2023-01-16 MED ORDER — KETOROLAC TROMETHAMINE 30 MG/ML IJ SOLN
30.0000 mg | Freq: Once | INTRAMUSCULAR | Status: AC
  Administered 2023-01-16: 30 mg via INTRAVENOUS
  Filled 2023-01-16: qty 1

## 2023-01-16 MED ORDER — ONDANSETRON HCL 4 MG PO TABS: 4.0000 mg | ORAL_TABLET | Freq: Four times a day (QID) | ORAL | Status: DC | PRN

## 2023-01-16 MED ORDER — HYDROCHLOROTHIAZIDE 25 MG PO TABS
25.0000 mg | ORAL_TABLET | Freq: Every day | ORAL | Status: DC
  Administered 2023-01-16 – 2023-01-17 (×2): 25 mg via ORAL
  Filled 2023-01-16 (×2): qty 1

## 2023-01-16 MED ORDER — LOSARTAN POTASSIUM 50 MG PO TABS
100.0000 mg | ORAL_TABLET | Freq: Every day | ORAL | Status: DC
  Administered 2023-01-16 – 2023-01-17 (×2): 100 mg via ORAL
  Filled 2023-01-16 (×2): qty 2

## 2023-01-16 MED ORDER — HYDROCOD POLI-CHLORPHE POLI ER 10-8 MG/5ML PO SUER: 5.0000 mL | Freq: Two times a day (BID) | ORAL | Status: DC | PRN

## 2023-01-16 NOTE — ED Notes (Signed)
 Pt refused repeat blood work. This EDT explained the importance to pt of the repeat blood draw but pt said he still refused and that all he wants is to see the doctor.

## 2023-01-16 NOTE — ED Provider Notes (Signed)
 [facility] Provider Note    Event Date/Time   First MD Initiated Contact with Patient 01/16/23 0133     (approximate)   History   Chest Pain   HPI  Curtis [name] is a 48 y.o. male with history of hypertension, diabetes, hyperlipidemia who presents to the emergency department shortness of breath, wheezing and chest tightness ongoing for several days.  Was seen at urgent care and put on albuterol  inhaler, prednisone , cough medication and doxycycline  without any relief.  States he has not been able to sleep in several days due to the chest discomfort, shortness of breath.  Denies history of asthma, COPD.  No history of PE, DVT.  No lower extremity swelling or pain.  Symptoms worse at night when he is trying to lie flat and go to sleep.  No history of CHF.  He is a smoker.   History provided by patient.    Past Medical History:  Diagnosis Date   Allergies     Past Surgical History:  Procedure Laterality Date   COLONOSCOPY WITH PROPOFOL  N/A 11/08/2021   Procedure: COLONOSCOPY WITH PROPOFOL ;  Surgeon: Therisa Bi, MD;  Location: [facility] ENDOSCOPY;  Service: Gastroenterology;  Laterality: N/A;    MEDICATIONS:  Prior to Admission medications   Medication Sig Start Date End Date Taking? Authorizing Provider  amLODipine  (NORVASC ) 5 MG tablet TAKE 1 TABLET (5 MG TOTAL) BY MOUTH DAILY. 12/10/22   Ostwalt, Janna, PA-C  atorvastatin  (LIPITOR) 10 MG tablet Take 1 tablet (10 mg total) by mouth daily. 09/11/22   Ostwalt, Janna, PA-C  ketorolac  (TORADOL ) 10 MG tablet Take 1 tablet (10 mg total) by mouth every 6 (six) hours as needed. Patient not taking: Reported on 09/11/2022 04/08/22   Cuthriell, Dorn BIRCH, PA-C  losartan -hydrochlorothiazide  (HYZAAR) 100-25 MG tablet TAKE 1 TABLET BY MOUTH EVERY DAY 11/22/22   Ostwalt, Janna, PA-C  metFORMIN  (GLUCOPHAGE ) 500 MG tablet Take 1 tablet (500 mg total) by mouth 2 (two) times daily with a meal. 09/11/22   Ostwalt, Janna, PA-C   oxyCODONE -acetaminophen  (PERCOCET) 5-325 MG tablet Take 1 tablet by mouth every 4 (four) hours as needed for severe pain. Patient not taking: Reported on 09/11/2022 10/08/21   Dorothyann Drivers, MD  varenicline  (CHANTIX ) 1 MG tablet TAKE 1 TABLET BY MOUTH TWICE A DAY 10/04/22   Ostwalt, Janna, PA-C    Physical Exam   Triage Vital Signs: ED Triage Vitals [01/15/23 2136]  Encounter Vitals Group     BP (!) 187/131     Systolic BP Percentile      Diastolic BP Percentile      Pulse Rate (!) 106     Resp 20     Temp 98.1 F (36.7 C)     Temp Source Oral     SpO2 98 %     Weight (!) 310 lb (140.6 kg)     Height 6' 1 (1.854 m)     Head Circumference      Peak Flow      Pain Score 0     Pain Loc      Pain Education      Exclude from Growth Chart     Most recent vital signs: Vitals:   01/16/23 0230 01/16/23 0300  BP: (!) 156/96 (!) 154/98  Pulse: 88 85  Resp: (!) 21 19  Temp:    SpO2: 95% 95%    CONSTITUTIONAL: Alert, responds appropriately to questions. Well-appearing; well-nourished HEAD: Normocephalic, atraumatic EYES: Conjunctivae clear,  pupils appear equal, sclera nonicteric ENT: normal nose; moist mucous membranes NECK: Supple, normal ROM CARD: RRR; S1 and S2 appreciated RESP: Normal chest excursion without splinting, patient has mild tachypnea, no hypoxia on room air at rest, speaking full sentences, diffuse rhonchorous breath sounds and wheezing.  Diminished aeration at bases bilaterally.  No rales. ABD/GI: Non-distended; soft, non-tender, no rebound, no guarding, no peritoneal signs BACK: The back appears normal EXT: Normal ROM in all joints; no deformity noted, no edema, no calf tenderness or calf swelling SKIN: Normal color for age and race; warm; no rash on exposed skin NEURO: Moves all extremities equally, normal speech PSYCH: The patient's mood and manner are appropriate.   ED Results / Procedures / Treatments   LABS: (all labs ordered are listed, but  only abnormal results are displayed) Labs Reviewed  BASIC METABOLIC PANEL - Abnormal; Notable for the following components:      Result Value   Glucose, Bld 134 (*)    All other components within normal limits  CBC - Abnormal; Notable for the following components:   WBC 15.4 (*)    RDW 16.0 (*)    All other components within normal limits  TROPONIN I (HIGH SENSITIVITY) - Abnormal; Notable for the following components:   Troponin I (High Sensitivity) 23 (*)    All other components within normal limits  TROPONIN I (HIGH SENSITIVITY) - Abnormal; Notable for the following components:   Troponin I (High Sensitivity) 22 (*)    All other components within normal limits  RESP PANEL BY RT-PCR (RSV, FLU A&B, COVID)  [id number]     EKG:  EKG Interpretation Date/Time:  Wednesday January 15 2023 21:40:16 EST Ventricular Rate:  99 PR Interval:  132 QRS Duration:  94 QT Interval:  360 QTC Calculation: 462 R Axis:   123  Text Interpretation: Normal sinus rhythm Right axis deviation Possible Anterior infarct (cited on or before 11-Aug-2008) Abnormal ECG When compared with ECG of 20-Aug-2019 08:24, No significant change was found Confirmed by Neomi Neptune [phone]) on 01/16/2023 4:14:14 AM         RADIOLOGY: My personal review and interpretation of imaging: Chest x-ray clear.  I have personally reviewed all radiology reports.   DG Chest 2 View Result Date: 01/15/2023 CLINICAL DATA:  chest pain EXAM: CHEST - 2 VIEW COMPARISON:  Chest x-ray 02/16/2018 FINDINGS: The heart and mediastinal contours are within normal limits. No focal consolidation. No pulmonary edema. No pleural effusion. No pneumothorax. No acute osseous abnormality. IMPRESSION: No active cardiopulmonary disease. Electronically Signed   By: Morgane  Naveau M.D.   On: 01/15/2023 22:22     PROCEDURES:  Critical Care performed: No    .1-3 Lead EKG Interpretation  Performed by: Vergia Chea, Neptune SAILOR, DO Authorized by: Lovie Agresta, Neptune SAILOR,  DO     Interpretation: normal     ECG rate:  85   ECG rate assessment: normal     Rhythm: sinus rhythm     Ectopy: none     Conduction: normal       IMPRESSION / MDM / ASSESSMENT AND PLAN / ED COURSE  I reviewed the triage vital signs and the nursing notes.    Patient here with wheezing, rhonchi in the setting of tobacco use, viral-like symptoms.  The patient is on the cardiac monitor to evaluate for evidence of arrhythmia and/or significant heart rate changes.   DIFFERENTIAL DIAGNOSIS (includes but not limited to):   Bronchitis, bronchospasm, pneumonia, less likely PE, pneumothorax, CHF, low  suspicion for ACS   Patient's presentation is most consistent with acute presentation with potential threat to life or bodily function.   PLAN: Patient is a leukocytosis of 15,000.  This appears chronic for patient patient is also on steroids.  Normal electrolytes.  First troponin 23.  Will obtain second troponin.  Chest x-ray reviewed and interpreted by myself and radiologist and is clear.  Will obtain COVID and flu swab.  Will give breathing treatments, Solu-Medrol , Toradol  for symptomatic relief.   MEDICATIONS GIVEN IN ED: Medications  ketorolac  (TORADOL ) 30 MG/ML injection 30 mg (30 mg Intravenous Given 01/16/23 0204)  methylPREDNISolone  sodium succinate (SOLU-MEDROL ) 125 mg/2 mL injection 125 mg (125 mg Intravenous Given 01/16/23 0206)  albuterol  (PROVENTIL ) (2.5 MG/3ML) 0.083% nebulizer solution 5 mg (5 mg Nebulization Given 01/16/23 0207)  ipratropium (ATROVENT ) nebulizer solution 0.5 mg (0.5 mg Nebulization Given 01/16/23 0207)  albuterol  (PROVENTIL ) (2.5 MG/3ML) 0.083% nebulizer solution 5 mg (5 mg Nebulization Given 01/16/23 0242)  ipratropium (ATROVENT ) nebulizer solution 0.5 mg (0.5 mg Nebulization Given 01/16/23 0242)  ipratropium-albuterol  (DUONEB) 0.5-2.5 (3) MG/3ML nebulizer solution 3 mL (3 mLs Nebulization Given 01/16/23 0302)     ED COURSE: No significant improvement in breathing,  wheezing, rhonchi after 3 breathing treatments.  Will admit to the hospitalist service.  Patient comfortable with this plan.  No hypoxia.  Second troponin flat.   CONSULTS:  Consulted and discussed patient's case with hospitalist, Dr. Lawence.  I have recommended admission and consulting physician agrees and will place admission orders.  Patient (and family if present) agree with this plan.   I reviewed all nursing notes, vitals, pertinent previous records.  All labs, EKGs, imaging ordered have been independently reviewed and interpreted by myself.    OUTSIDE RECORDS REVIEWED: Reviewed last family medicine note in August 2024.       FINAL CLINICAL IMPRESSION(S) / ED DIAGNOSES   Final diagnoses:  Bronchospasm  Atypical chest pain     Rx / DC Orders   ED Discharge Orders     None        Note:  This document was prepared using Dragon voice recognition software and may include unintentional dictation errors.   Khamari Sheehan, Josette SAILOR, DO 01/16/23 [phone]

## 2023-01-16 NOTE — ED Notes (Signed)
 Hospitalist at bedside

## 2023-01-16 NOTE — H&P (Signed)
 History and Physical    ARIHANT PENNINGS FMW:[id number] DOB: [birth year] DOA: 01/16/2023  PCP: Ostwalt, Janna, PA-C (Confirm with patient/family/NH records and if not entered, this has to be entered at [facility] point of entry) Patient coming from: Home  I have personally briefly reviewed patient's old medical records in [facility]  Chief Complaint: Cough, wheezing, shortness of breath  HPI: Curtis [name] is a 48 y.o. male with medical history significant of seasonal allergy, HTN, cigarette smoking, IIDM, presented with worsening of cough wheezing shortness of breath.  Patient reported with history of seasonal allergy  when the weather gets cold, usually with coughing and wheezing.  This time he started to have shortness of breath within the past 1 weeks, did not develop productive cough with occasionally coughing up clear phlegm, went to see urgent care 3 days ago was diagnosed with bronchitis and sent home with doxycycline  and prednisone .  Denied any chest pain no fever or chills.  Last night his symptoms became worse and came to ED.  He tried to quit smoking last month and was started on Chantix  however he has not been coherent and resumed smoking recently. ED Course: Presented tachycardia blood pressure elevated O2 saturation 95% on room air.  Blood work showed WBC 15.4, hemoglobin 13.5 creatinine 0.9.  Chest x-ray showed no acute infiltrates.  And patient was given multiple rounds of DuoNebs and IV Solu-Medrol  but continued to be wheezing and tachypneic.  Review of Systems: As per HPI otherwise 14 point review of systems negative.    Past Medical History:  Diagnosis Date   Allergies     Past Surgical History:  Procedure Laterality Date   COLONOSCOPY WITH PROPOFOL  N/A 11/08/2021   Procedure: COLONOSCOPY WITH PROPOFOL ;  Surgeon: Therisa Bi, MD;  Location: [facility] ENDOSCOPY;  Service: Gastroenterology;  Laterality: N/A;     reports that he has been smoking cigarettes. He has never  used smokeless tobacco. He reports current alcohol use of about 10.0 standard drinks of alcohol per week. He reports that he does not use drugs.  No Known Allergies  Family History  Problem Relation Age of Onset   Diabetes Maternal Uncle    Diabetes Paternal Uncle     Prior to Admission medications   Medication Sig Start Date End Date Taking? Authorizing Provider  albuterol  (VENTOLIN  HFA) 108 (90 Base) MCG/ACT inhaler Inhale into the lungs. 01/14/23  Yes [provider]  amLODipine  (NORVASC ) 5 MG tablet TAKE 1 TABLET (5 MG TOTAL) BY MOUTH DAILY. 12/10/22  Yes Ostwalt, Janna, PA-C  atorvastatin  (LIPITOR) 10 MG tablet Take 1 tablet (10 mg total) by mouth daily. 09/11/22  Yes Ostwalt, Janna, PA-C  benzonatate  (TESSALON ) 100 MG capsule Take 100 mg by mouth 3 (three) times daily. 01/14/23  Yes [provider]  chlorpheniramine-HYDROcodone  (TUSSIONEX) 10-8 MG/5ML Take by mouth. 01/14/23  Yes [provider]  doxycycline  (VIBRAMYCIN ) 100 MG capsule Take 100 mg by mouth 2 (two) times daily. 01/14/23  Yes [provider]  losartan -hydrochlorothiazide  (HYZAAR) 100-25 MG tablet TAKE 1 TABLET BY MOUTH EVERY DAY 11/22/22  Yes Ostwalt, Janna, PA-C  metFORMIN  (GLUCOPHAGE ) 500 MG tablet Take 1 tablet (500 mg total) by mouth 2 (two) times daily with a meal. 09/11/22  Yes Ostwalt, Janna, PA-C  predniSONE  (DELTASONE ) 20 MG tablet Take by mouth. 01/14/23  Yes [provider]  varenicline  (CHANTIX ) 1 MG tablet TAKE 1 TABLET BY MOUTH TWICE A DAY 10/04/22  Yes Ostwalt, Janna, PA-C  ketorolac  (TORADOL ) 10 MG  tablet Take 1 tablet (10 mg total) by mouth every 6 (six) hours as needed. Patient not taking: Reported on 09/11/2022 04/08/22   Cuthriell, Dorn BIRCH, PA-C  oxyCODONE -acetaminophen  (PERCOCET) 5-325 MG tablet Take 1 tablet by mouth every 4 (four) hours as needed for severe pain. Patient not taking: Reported on 09/11/2022 10/08/21   Dorothyann Drivers, MD    Physical  Exam: Vitals:   01/16/23 0230 01/16/23 0300 01/16/23 0605 01/16/23 0633  BP: (!) 156/96 (!) 154/98  (!) 155/98  Pulse: 88 85 66 68  Resp: (!) 21 19 20  (!) 22  Temp:    98.2 F (36.8 C)  TempSrc:    Oral  SpO2: 95% 95% 99% 96%  Weight:      Height:        Constitutional: NAD, calm, comfortable Vitals:   01/16/23 0230 01/16/23 0300 01/16/23 0605 01/16/23 0633  BP: (!) 156/96 (!) 154/98  (!) 155/98  Pulse: 88 85 66 68  Resp: (!) 21 19 20  (!) 22  Temp:    98.2 F (36.8 C)  TempSrc:    Oral  SpO2: 95% 95% 99% 96%  Weight:      Height:       Eyes: PERRL, lids and conjunctivae normal ENMT: Mucous membranes are moist. Posterior pharynx clear of any exudate or lesions.Normal dentition.  Neck: normal, supple, no masses, no thyromegaly Respiratory: Diminished breathing sound bilaterally, diffused wheezing, scattered crackles, increasing breathing effort respiratory effort. No accessory muscle use.  Cardiovascular: Regular rate and rhythm, no murmurs / rubs / gallops. No extremity edema. 2+ pedal pulses. No carotid bruits.  Abdomen: no tenderness, no masses palpated. No hepatosplenomegaly. Bowel sounds positive.  Musculoskeletal: no clubbing / cyanosis. No joint deformity upper and lower extremities. Good ROM, no contractures. Normal muscle tone.  Skin: no rashes, lesions, ulcers. No induration Neurologic: CN 2-12 grossly intact. Sensation intact, DTR normal. Strength 5/5 in all 4.  Psychiatric: Normal judgment and insight. Alert and oriented x 3. Normal mood.     Labs on Admission: I have personally reviewed following labs and imaging studies  CBC: Recent Labs  Lab 01/15/23 2139  WBC 15.4*  HGB 13.5  HCT 41.5  MCV 85.7  PLT 279   Basic Metabolic Panel: Recent Labs  Lab 01/15/23 2139  NA 139  K 3.9  CL 101  CO2 25  GLUCOSE 134*  BUN 16  CREATININE 0.94  CALCIUM  9.0   GFR: Estimated Creatinine Clearance: 141.6 mL/min (by C-G formula based on SCr of 0.94  mg/dL). Liver Function Tests: No results for input(s): AST, ALT, ALKPHOS, BILITOT, PROT, ALBUMIN in the last 168 hours. No results for input(s): LIPASE, AMYLASE in the last 168 hours. No results for input(s): AMMONIA in the last 168 hours. Coagulation Profile: No results for input(s): INR, PROTIME in the last 168 hours. Cardiac Enzymes: No results for input(s): CKTOTAL, CKMB, CKMBINDEX, TROPONINI in the last 168 hours. BNP (last 3 results) No results for input(s): PROBNP in the last 8760 hours. HbA1C: No results for input(s): HGBA1C in the last 72 hours. CBG: No results for input(s): GLUCAP in the last 168 hours. Lipid Profile: No results for input(s): CHOL, HDL, LDLCALC, TRIG, CHOLHDL, LDLDIRECT in the last 72 hours. Thyroid Function Tests: No results for input(s): TSH, T4TOTAL, FREET4, T3FREE, THYROIDAB in the last 72 hours. Anemia Panel: No results for input(s): VITAMINB12, FOLATE, FERRITIN, TIBC, IRON, RETICCTPCT in the last 72 hours. Urine analysis:    Component Value Date/Time   COLORURINE  YELLOW (A) 04/12/2017 1603   APPEARANCEUR CLEAR (A) 04/12/2017 1603   APPEARANCEUR Clear 11/17/2013 2205   LABSPEC 1.030 04/12/2017 1603   LABSPEC 1.020 11/17/2013 2205   PHURINE 5.0 04/12/2017 1603   GLUCOSEU NEGATIVE 04/12/2017 1603   GLUCOSEU Negative 11/17/2013 2205   HGBUR NEGATIVE 04/12/2017 1603   BILIRUBINUR NEGATIVE 04/12/2017 1603   BILIRUBINUR Negative 11/17/2013 2205   KETONESUR NEGATIVE 04/12/2017 1603   PROTEINUR 30 (A) 04/12/2017 1603   NITRITE NEGATIVE 04/12/2017 1603   LEUKOCYTESUR NEGATIVE 04/12/2017 1603   LEUKOCYTESUR Negative 11/17/2013 2205    Radiological Exams on Admission: DG Chest 2 View Result Date: 01/15/2023 CLINICAL DATA:  chest pain EXAM: CHEST - 2 VIEW COMPARISON:  Chest x-ray 02/16/2018 FINDINGS: The heart and mediastinal contours are within normal limits. No focal consolidation.  No pulmonary edema. No pleural effusion. No pneumothorax. No acute osseous abnormality. IMPRESSION: No active cardiopulmonary disease. Electronically Signed   By: Morgane  Naveau M.D.   On: 01/15/2023 22:22    EKG: Independently reviewed.  Sinus tachycardia, no acute ST changes.  Assessment/Plan Principal Problem:   Acute bronchitis with bronchospasm Active Problems:   Asthma  (please populate well all problems here in Problem List. (For example, if patient is on BP meds at home and you resume or decide to hold them, it is a problem that needs to be her. Same for CAD, COPD, HLD and so on)  Acute asthma exacerbation -Continue IV Solu-Medrol  every 6 hours, expect to switch to p.o. prednisone  within 24 hours -DuoNebs, as needed albuterol  -Peak flow, incentive spirometry -ICS and LABA -Continue doxycycline  which was started 2 days ago by urgent care. -Trigger likely seasonal allergy, will send respiratory pathogen panels and atypical pneumonia study.  SIRS -Tachypneic and tachycardia, leukocytosis, leukocytosis likely secondary to bronchitis and steroid use which was started 2 days ago by urgent care, doubt sepsis -Antibiotics to cover atypical lung infection -Euvolemic, hold off IV fluid  HTN, uncontrolled -Resume home BP meds including hydrochlorothiazide ,  losartan  and amlodipine   IIDM -Continue metformin   Cigarette smoking -Cessation education performed at bedside, Nicotine  patch for now  DVT prophylaxis: Lovenox  Code Status: Full code Family Communication: None at bedside Disposition Plan: Expect less than 2 midnight hospital stay Consults called: None Admission status: Tele obs   Cort ONEIDA Mana MD [facility] Pager [phone]  01/16/2023, 8:00 AM

## 2023-01-17 DIAGNOSIS — J209 Acute bronchitis, unspecified: Secondary | ICD-10-CM | POA: Diagnosis not present

## 2023-01-17 LAB — MYCOPLASMA PNEUMONIAE ANTIBODY, IGM: Mycoplasma pneumo IgM: <770 U/mL (ref 0–769)

## 2023-01-17 MED ORDER — LORATADINE 10 MG PO TABS: 10.0000 mg | ORAL_TABLET | Freq: Every evening | ORAL | 2 refills | Status: AC | PRN

## 2023-01-17 MED ORDER — PANTOPRAZOLE SODIUM 40 MG PO TBEC
40.0000 mg | DELAYED_RELEASE_TABLET | Freq: Every day | ORAL | Status: DC
  Administered 2023-01-17: 40 mg via ORAL
  Filled 2023-01-17: qty 1

## 2023-01-17 MED ORDER — METHYLPREDNISOLONE SODIUM SUCC 40 MG IJ SOLR: 40.0000 mg | Freq: Two times a day (BID) | INTRAMUSCULAR | Status: DC

## 2023-01-17 MED ORDER — PREDNISONE 20 MG PO TABS: 40.0000 mg | ORAL_TABLET | Freq: Every day | ORAL | Status: DC

## 2023-01-17 MED ORDER — MENTHOL 3 MG MT LOZG: 1.0000 | LOZENGE | OROMUCOSAL | Status: DC | PRN

## 2023-01-17 MED ORDER — GUAIFENESIN ER 600 MG PO TB12
600.0000 mg | ORAL_TABLET | Freq: Two times a day (BID) | ORAL | Status: DC
  Administered 2023-01-17: 600 mg via ORAL
  Filled 2023-01-17: qty 1

## 2023-01-17 NOTE — Progress Notes (Signed)
   01/16/23 2039  Assess: MEWS Score  Temp 98.2 F (36.8 C)  BP (!) 169/82  MAP (mmHg) 105  Pulse Rate (!) 102  Level of Consciousness Alert  SpO2 95 %  O2 Device Room Air  Assess: MEWS Score  MEWS Temp 0  MEWS Systolic 0  MEWS Pulse 1  MEWS RR 1  MEWS LOC 0  MEWS Score 2  MEWS Score Color Yellow  Assess: if the MEWS score is Yellow or Red  Were vital signs accurate and taken at a resting state? Yes  Does the patient meet 2 or more of the SIRS criteria? No  MEWS guidelines implemented  Yes, yellow  Treat  MEWS Interventions Considered administering scheduled or prn medications/treatments as ordered  Take Vital Signs  Increase Vital Sign Frequency  Yellow: Q2hr x1, continue Q4hrs until patient remains green for 12hrs  Escalate  MEWS: Escalate Yellow: Discuss with charge nurse and consider notifying provider and/or RRT  Notify: Charge Nurse/RN  Name of Charge Nurse/RN Notified Charyl RN  Provider Notification  Provider Name/Title Erminio Cone NP  Method of Notification Page  Notification Reason Change in status  Assess: SIRS CRITERIA  SIRS Temperature  0  SIRS Respirations  1  SIRS Pulse 1  SIRS WBC 0  SIRS Score Sum  2

## 2023-01-17 NOTE — Plan of Care (Signed)

## 2023-01-17 NOTE — TOC Initial Note (Signed)
 Transition of Care [facility]) - Initial/Assessment Note    Patient Details  Name: Curtis [name] MRN: [id number] Date of Birth: 08-31-1975  Transition of Care [facility] CM/SW Contact:    Royanne JINNY Bernheim, RN Phone Number: 01/17/2023, 9:14 AM  Clinical Narrative:                  Transition of Care (TOC) Screening Note   Patient Details  Name: Curtis [name] Date of Birth: 1975-04-06   Transition of Care [facility]) CM/SW Contact:    Royanne JINNY Bernheim, RN Phone Number: 01/17/2023, 9:14 AM    Transition of Care Department [facility] has reviewed patient and no TOC needs have been identified at this time. We will continue to monitor patient advancement through interdisciplinary progression rounds. If new patient transition needs arise, please place a TOC consult.          Patient Goals and CMS Choice            Expected Discharge Plan and Services                                              Prior Living Arrangements/Services                       Activities of Daily Living   ADL Screening (condition at time of admission) Independently performs ADLs?: Yes (appropriate for developmental age) Is the patient deaf or have difficulty hearing?: No Does the patient have difficulty seeing, even when wearing glasses/contacts?: No Does the patient have difficulty concentrating, remembering, or making decisions?: No  Permission Sought/Granted                  Emotional Assessment              Admission diagnosis:  Acute bronchitis with bronchospasm [J20.9] Bronchospasm [J98.01] Atypical chest pain [R07.89] Asthma [J45.909] Patient Active Problem List   Diagnosis Date Noted   Acute bronchitis with bronchospasm 01/16/2023   Asthma 01/16/2023   Adenomatous polyp of colon    Annual physical exam 10/05/2021   Smoking 10/05/2021   Encounter for screening for HIV 10/05/2021   Need for hepatitis C screening test 10/05/2021   Screen for colon cancer  10/05/2021   2+ pitting edema 10/05/2021   Morbid obesity (HCC) 09/19/2021   Hypertension 09/19/2021   Dyspnea 09/19/2021   Leg swelling 09/19/2021   PCP:  Ostwalt, Janna, PA-C Pharmacy:   [facility] [phone] - [place], [place] - [address] [place] [address] Phone: [phone] Fax: [phone]     Social Drivers of Health (SDOH) Social History: SDOH Screenings   Food Insecurity: No Food Insecurity (01/16/2023)  Housing: Low Risk  (01/16/2023)  Transportation Needs: No Transportation Needs (01/16/2023)  Utilities: Not At Risk (01/16/2023)  Alcohol Screen: Medium Risk (09/05/2021)  Depression (PHQ2-9): Low Risk  (10/03/2021)  Tobacco Use: High Risk (01/15/2023)   SDOH Interventions:     Readmission Risk Interventions     No data to display

## 2023-01-17 NOTE — Discharge Summary (Signed)
 [facility] Discharge Summary   Patient: Curtis [name] FMW:[id number]  PCP: Ostwalt, Janna, PA-C  Date of admission: 01/16/2023   Date of discharge: 01/17/2023     Discharge Diagnoses:  Principal Problem:   Acute bronchitis with bronchospasm Active Problems:   Asthma   Admitted From: Home Disposition:  Home   Recommendations for Outpatient Follow-up:  PCP: in 1 wk Follow up LABS/TEST:     Follow-up Information     Ostwalt, Janna, PA-C Follow up in 1 week(s).   Specialty: Physician Assistant Contact information: [address] [place] [address] [phone]                Diet recommendation: Cardiac and Carb modified diet  Activity: The patient is advised to gradually reintroduce usual activities, as tolerated  Discharge Condition: stable  Code Status: Full code   History of present illness: As per the H and P dictated on admission Hospital Course:  Curtis [name] is a 48 y.o. male with medical history significant of seasonal allergy, HTN, cigarette smoking, IIDM, presented with worsening of cough wheezing shortness of breath.   Patient reported with history of seasonal allergy  when the weather gets cold, usually with coughing and wheezing.  This time he started to have shortness of breath within the past 1 weeks, did not develop productive cough with occasionally coughing up clear phlegm, went to see urgent care 3 days ago was diagnosed with bronchitis and sent home with doxycycline  and prednisone .  Denied any chest pain no fever or chills.  Last night his symptoms became worse and came to ED.  He tried to quit smoking last month and was started on Chantix  however he has not been coherent and resumed smoking recently. ED Course: Presented tachycardia blood pressure elevated O2 saturation 95% on room air.  Blood work showed WBC 15.4, hemoglobin 13.5 creatinine 0.9.  Chest x-ray showed no acute infiltrates.  And patient was given multiple  rounds of DuoNebs and IV Solu-Medrol  but continued to be wheezing and tachypneic.   Assessment/Plan   # RSV viral infection # Acute asthma exacerbation secondary to RSV viral infection Respiratory viral panel positive for RSV, negative COVID and flu. S/p IV Solu-Medrol  every 6 hours, DuoNeb as needed, Dulera  inhaler, Mucinex  given during hospital stay.  Patient's breathing improved, wheezing resolved.  Patient wanted to go home.  Patient was advised to continue prednisone  and doxycycline  which was given during urgent care visit.  Continue albuterol  as needed at home and follow with PCP in 1 week. # HTN, uncontrolled, elevated BP could be due to acute exacerbation of asthma Resume home BP meds including hydrochlorothiazide ,  losartan  and amlodipine .  Recommended to monitor BP at home and follow with PCP to titrate medications accordingly # IIDM, Continue metformin  # Cigarette smoking: Cessation education performed at bedside, s/p Nicotine  patch   # Morbid obesity Body mass index is 40.9 kg/m.  Nutrition Interventions: Calorie restricted diet and daily exercise advised to lose body weight.  Lifestyle modification discussed.  Patient was ambulatory without any assistance. On the day of the discharge the patient's vitals were stable, and no other acute medical condition were reported by patient. the patient was felt safe to be discharge at Home.  Consultants: None Procedures: None  Discharge Exam: General: Appear in no distress, no Rash; Oral Mucosa Clear, moist. Cardiovascular: S1 and S2 Present, no Murmur, Respiratory: normal respiratory effort, Bilateral Air entry present and no Crackles, no wheezes Abdomen: Bowel Sound present, Soft  and no tenderness, no hernia Extremities: no Pedal edema, no calf tenderness Neurology: alert and oriented to time, place, and person affect appropriate.  Filed Weights   01/15/23 2136  Weight: (!) 140.6 kg   Vitals:   01/17/23 0747 01/17/23 0812   BP:  (!) 147/92  Pulse:  93  Resp:  18  Temp:  98.1 F (36.7 C)  SpO2: 100% 93%    DISCHARGE MEDICATION: Allergies as of 01/17/2023   No Known Allergies      Medication List     STOP taking these medications    ketorolac  10 MG tablet Commonly known as: TORADOL    oxyCODONE -acetaminophen  5-325 MG tablet Commonly known as: Percocet       TAKE these medications    albuterol  108 (90 Base) MCG/ACT inhaler Commonly known as: VENTOLIN  HFA Inhale into the lungs.   amLODipine  5 MG tablet Commonly known as: NORVASC  TAKE 1 TABLET (5 MG TOTAL) BY MOUTH DAILY.   atorvastatin  10 MG tablet Commonly known as: LIPITOR Take 1 tablet (10 mg total) by mouth daily.   benzonatate  100 MG capsule Commonly known as: TESSALON  Take 100 mg by mouth 3 (three) times daily.   chlorpheniramine-HYDROcodone  10-8 MG/5ML Commonly known as: TUSSIONEX Take by mouth.   doxycycline  100 MG capsule Commonly known as: VIBRAMYCIN  Take 100 mg by mouth 2 (two) times daily.   loratadine  10 MG tablet Commonly known as: Claritin  Take 1 tablet (10 mg total) by mouth at bedtime as needed for allergies or rhinitis.   losartan -hydrochlorothiazide  100-25 MG tablet Commonly known as: HYZAAR TAKE 1 TABLET BY MOUTH EVERY DAY   metFORMIN  500 MG tablet Commonly known as: GLUCOPHAGE  Take 1 tablet (500 mg total) by mouth 2 (two) times daily with a meal.   predniSONE  20 MG tablet Commonly known as: DELTASONE  Take by mouth.   varenicline  1 MG tablet Commonly known as: CHANTIX  TAKE 1 TABLET BY MOUTH TWICE A DAY       No Known Allergies Discharge Instructions     Call MD for:  difficulty breathing, headache or visual disturbances   Complete by: As directed    Call MD for:  extreme fatigue   Complete by: As directed    Call MD for:  persistant dizziness or light-headedness   Complete by: As directed    Call MD for:  persistant nausea and vomiting   Complete by: As directed    Call MD for:   severe uncontrolled pain   Complete by: As directed    Call MD for:  temperature >100.4   Complete by: As directed    Diet - low sodium heart healthy   Complete by: As directed    Discharge instructions   Complete by: As directed    F/u PCP in 1 wk   Increase activity slowly   Complete by: As directed        The results of significant diagnostics from this hospitalization (including imaging, microbiology, ancillary and laboratory) are listed below for reference.    Significant Diagnostic Studies: DG Chest 2 View Result Date: 01/15/2023 CLINICAL DATA:  chest pain EXAM: CHEST - 2 VIEW COMPARISON:  Chest x-ray 02/16/2018 FINDINGS: The heart and mediastinal contours are within normal limits. No focal consolidation. No pulmonary edema. No pleural effusion. No pneumothorax. No acute osseous abnormality. IMPRESSION: No active cardiopulmonary disease. Electronically Signed   By: Morgane  Naveau M.D.   On: 01/15/2023 22:22    Microbiology: Recent Results (from the past 240 hours)  Resp panel by RT-PCR (RSV, Flu A&B, Covid) Anterior Nasal Swab     Status: Abnormal   Collection Time: 01/16/23  5:06 AM   Specimen: Anterior Nasal Swab  Result Value Ref Range Status   SARS Coronavirus 2 by RT PCR NEGATIVE NEGATIVE Final    Comment: (NOTE) SARS-CoV-2 target nucleic acids are NOT DETECTED.  The SARS-CoV-2 RNA is generally detectable in upper respiratory specimens during the acute phase of infection. The lowest concentration of SARS-CoV-2 viral copies this assay can detect is 138 copies/mL. A negative result does not preclude SARS-Cov-2 infection and should not be used as the sole basis for treatment or other patient management decisions. A negative result may occur with  improper specimen collection/handling, submission of specimen other than nasopharyngeal swab, presence of viral mutation(s) within the areas targeted by this assay, and inadequate number of viral copies(<138 copies/mL). A  negative result must be combined with clinical observations, patient history, and epidemiological information. The expected result is Negative.  Fact Sheet for Patients:  bloggercourse.com  Fact Sheet for Healthcare Providers:  seriousbroker.it  This test is no t yet approved or cleared by the [place]  FDA and  has been authorized for detection and/or diagnosis of SARS-CoV-2 by FDA under an Emergency Use Authorization (EUA). This EUA will remain  in effect (meaning this test can be used) for the duration of the COVID-19 declaration under Section 564(b)(1) of the Act, 21 U.S.C.section 360bbb-3(b)(1), unless the authorization is terminated  or revoked sooner.       Influenza A by PCR NEGATIVE NEGATIVE Final   Influenza B by PCR NEGATIVE NEGATIVE Final    Comment: (NOTE) The Xpert Xpress SARS-CoV-2/FLU/RSV plus assay is intended as an aid in the diagnosis of influenza from Nasopharyngeal swab specimens and should not be used as a sole basis for treatment. Nasal washings and aspirates are unacceptable for Xpert Xpress SARS-CoV-2/FLU/RSV testing.  Fact Sheet for Patients: bloggercourse.com  Fact Sheet for Healthcare Providers: seriousbroker.it  This test is not yet approved or cleared by the [place]  FDA and has been authorized for detection and/or diagnosis of SARS-CoV-2 by FDA under an Emergency Use Authorization (EUA). This EUA will remain in effect (meaning this test can be used) for the duration of the COVID-19 declaration under Section 564(b)(1) of the Act, 21 U.S.C. section 360bbb-3(b)(1), unless the authorization is terminated or revoked.     Resp Syncytial Virus by PCR POSITIVE (A) NEGATIVE Final    Comment: (NOTE) Fact Sheet for Patients: bloggercourse.com  Fact Sheet for Healthcare  Providers: seriousbroker.it  This test is not yet approved or cleared by the [place]  FDA and has been authorized for detection and/or diagnosis of SARS-CoV-2 by FDA under an Emergency Use Authorization (EUA). This EUA will remain in effect (meaning this test can be used) for the duration of the COVID-19 declaration under Section 564(b)(1) of the Act, 21 U.S.C. section 360bbb-3(b)(1), unless the authorization is terminated or revoked.  Performed at [facility], [address] [place], [place] [address]   Respiratory (~20 pathogens) panel by PCR     Status: Abnormal   Collection Time: 01/16/23 10:49 AM   Specimen: Nasopharyngeal Swab; Respiratory  Result Value Ref Range Status   Adenovirus NOT DETECTED NOT DETECTED Final   Coronavirus 229E NOT DETECTED NOT DETECTED Final    Comment: (NOTE) The Coronavirus on the Respiratory Panel, DOES NOT test for the novel  Coronavirus (2019 nCoV)    Coronavirus HKU1 NOT DETECTED NOT DETECTED Final  Coronavirus NL63 NOT DETECTED NOT DETECTED Final   Coronavirus OC43 NOT DETECTED NOT DETECTED Final   Metapneumovirus NOT DETECTED NOT DETECTED Final   Rhinovirus / Enterovirus NOT DETECTED NOT DETECTED Final   Influenza A NOT DETECTED NOT DETECTED Final   Influenza B NOT DETECTED NOT DETECTED Final   Parainfluenza Virus 1 NOT DETECTED NOT DETECTED Final   Parainfluenza Virus 2 NOT DETECTED NOT DETECTED Final   Parainfluenza Virus 3 NOT DETECTED NOT DETECTED Final   Parainfluenza Virus 4 NOT DETECTED NOT DETECTED Final   Respiratory Syncytial Virus DETECTED (A) NOT DETECTED Final   Bordetella pertussis NOT DETECTED NOT DETECTED Final   Bordetella Parapertussis NOT DETECTED NOT DETECTED Final   Chlamydophila pneumoniae NOT DETECTED NOT DETECTED Final   Mycoplasma pneumoniae NOT DETECTED NOT DETECTED Final    Comment: Performed at [facility], [address], [place], [place] [address]      Labs: CBC: Recent Labs  Lab 01/15/23 2139  WBC 15.4*  HGB 13.5  HCT 41.5  MCV 85.7  PLT 279   Basic Metabolic Panel: Recent Labs  Lab 01/15/23 2139  NA 139  K 3.9  CL 101  CO2 25  GLUCOSE 134*  BUN 16  CREATININE 0.94  CALCIUM  9.0   Liver Function Tests: No results for input(s): AST, ALT, ALKPHOS, BILITOT, PROT, ALBUMIN in the last 168 hours. No results for input(s): LIPASE, AMYLASE in the last 168 hours. No results for input(s): AMMONIA in the last 168 hours. Cardiac Enzymes: No results for input(s): CKTOTAL, CKMB, CKMBINDEX, TROPONINI in the last 168 hours. BNP (last 3 results) No results for input(s): BNP in the last 8760 hours. CBG: No results for input(s): GLUCAP in the last 168 hours.  Time spent: 35 minutes  Signed:  Elvan Sor  [facility] 01/17/2023 9:38 AM

## 2023-01-18 LAB — LEGIONELLA PNEUMOPHILA SEROGP 1 UR AG: L. pneumophila Serogp 1 Ur Ag: NEGATIVE

## 2023-01-21 ENCOUNTER — Other Ambulatory Visit: Payer: Self-pay | Admitting: Physician Assistant

## 2023-01-21 DIAGNOSIS — I10 Essential (primary) hypertension: Secondary | ICD-10-CM

## 2023-01-21 NOTE — Telephone Encounter (Signed)
 Copied from CRM [id number]. Topic: General - Other >> Jan 21, 2023  9:57 AM Everette C wrote: Reason for CRM: Medication Refill - Most Recent Primary Care Visit:  Provider: OSTWALT, JANNA Department: [facility] Visit Type: OFFICE VISIT Date: 09/11/2022  Medication: losartan -hydrochlorothiazide  (HYZAAR) 100-25 MG tablet [[id number]]  Has the patient contacted their pharmacy? Yes (Agent: If no, request that the patient contact the pharmacy for the refill. If patient does not wish to contact the pharmacy document the reason why and proceed with request.) (Agent: If yes, when and what did the pharmacy advise?)  Is this the correct pharmacy for this prescription? Yes If no, delete pharmacy and type the correct one.  This is the patient's preferred pharmacy:  [id number] - [place], [place] - [address] [place] [address] Phone: [phone] Fax: [phone]   Has the prescription been filled recently? Yes  Is the patient out of the medication? No  Has the patient been seen for an appointment in the last year OR does the patient have an upcoming appointment? Yes  Can we respond through MyChart? No  Agent: Please be advised that Rx refills may take up to 3 business days. We ask that you follow-up with your pharmacy.

## 2023-01-22 ENCOUNTER — Other Ambulatory Visit: Payer: Self-pay

## 2023-01-22 MED ORDER — ALBUTEROL SULFATE HFA 108 (90 BASE) MCG/ACT IN AERS
1.0000 | INHALATION_SPRAY | Freq: Four times a day (QID) | RESPIRATORY_TRACT | 0 refills | Status: DC | PRN
  Filled 2023-01-22: qty 18, fill #0

## 2023-01-23 ENCOUNTER — Ambulatory Visit: Payer: 59 | Admitting: Family Medicine

## 2023-01-23 MED ORDER — LOSARTAN POTASSIUM-HCTZ 100-25 MG PO TABS: 1.0000 | ORAL_TABLET | Freq: Every day | ORAL | 0 refills | Status: DC

## 2023-01-23 NOTE — Telephone Encounter (Signed)
 Requested Prescriptions  Pending Prescriptions Disp Refills   losartan -hydrochlorothiazide  (HYZAAR) 100-25 MG tablet 90 tablet 0    Sig: Take 1 tablet by mouth daily.     Cardiovascular: ARB + Diuretic Combos Failed - 01/23/2023  1:08 PM      Failed - Last BP in normal range    BP Readings from Last 1 Encounters:  01/17/23 (!) 147/92         Passed - K in normal range and within 180 days    Potassium  Date Value Ref Range Status  01/15/2023 3.9 3.5 - 5.1 mmol/L Final  11/17/2013 3.4 (L) 3.5 - 5.1 mmol/L Final         Passed - Na in normal range and within 180 days    Sodium  Date Value Ref Range Status  01/15/2023 139 135 - 145 mmol/L Final  08/27/2022 142 134 - 144 mmol/L Final  11/17/2013 137 136 - 145 mmol/L Final         Passed - Cr in normal range and within 180 days    Creatinine  Date Value Ref Range Status  11/17/2013 1.01 0.60 - 1.30 mg/dL Final   Creatinine, Ser  Date Value Ref Range Status  01/15/2023 0.94 0.61 - 1.24 mg/dL Final         Passed - eGFR is 10 or above and within 180 days    EGFR (African American)  Date Value Ref Range Status  11/17/2013 >60 >19mL/min Final   GFR calc Af Amer  Date Value Ref Range Status  02/16/2018 >60 >60 mL/min Final   EGFR (Non-African Amer.)  Date Value Ref Range Status  11/17/2013 >60 >55mL/min Final    Comment:    eGFR values <62mL/min/1.73 m2 may be an indication of chronic kidney disease (CKD). Calculated eGFR, using the MRDR Study equation, is useful in  patients with stable renal function. The eGFR calculation will not be reliable in acutely ill patients when serum creatinine is changing rapidly. It is not useful in patients on dialysis. The eGFR calculation may not be applicable to patients at the low and high extremes of body sizes, pregnant women, and vegetarians.    GFR, Estimated  Date Value Ref Range Status  01/15/2023 >60 >60 mL/min Final    Comment:    (NOTE) Calculated using the CKD-EPI  Creatinine Equation (2021)    eGFR  Date Value Ref Range Status  08/27/2022 89 >59 mL/min/1.73 Final         Passed - Patient is not pregnant      Passed - Valid encounter within last 6 months    Recent Outpatient Visits           4 months ago Diabetes mellitus without complication [facility])   [place] [facility] [place], [place], PA-C   4 months ago Hypertension, unspecified type   [place] [facility] [place], [place], PA-C   1 year ago Hypertension, unspecified type   [place] [facility] [place], [place], PA-C   1 year ago Hypertension, unspecified type   [facility] [place], [place], PA-C   1 year ago Hypertension, unspecified type   [facility] [place], [place], PA-C       Future Appointments             In 2 weeks Ostwalt, Janna, PA-C [facility], [place]

## 2023-02-06 ENCOUNTER — Ambulatory Visit: Payer: 59 | Admitting: Physician Assistant

## 2023-02-09 NOTE — Progress Notes (Signed)
 Established patient visit  Patient: Curtis [name]   DOB: 1975/02/01   48 y.o. Male  MRN: [id number] Visit Date: 02/10/2023  Today's healthcare provider: Blane Bunting, PA-C   No chief complaint on file.  Subjective       Discussed the use of AI scribe software for clinical note transcription with the patient, who gave verbal consent to proceed.  History of Present Illness     01/17/23   RSV viral infection # Acute asthma exacerbation secondary to RSV viral infection Respiratory viral panel positive for RSV, negative COVID and flu. S/p IV Solu-Medrol  every 6 hours, DuoNeb as needed, Dulera  inhaler, Mucinex  given during hospital stay.  Patient's breathing improved, wheezing resolved.  Patient wanted to go home.  Patient was advised to continue prednisone  and doxycycline  which was given during urgent care visit.  Continue albuterol  as needed at home and follow with PCP in 1 week. # HTN, uncontrolled, elevated BP could be due to acute exacerbation of asthma Resume home BP meds including hydrochlorothiazide ,  losartan  and amlodipine .  Recommended to monitor BP at home and follow with PCP to titrate medications accordingly # IIDM, Continue metformin  # Cigarette smoking: Cessation education performed at bedside, s/p Nicotine  patch    # Morbid obesity Body mass index is 40.9 kg/m.  Nutrition Interventions: Calorie restricted diet and daily exercise advised to lose body weight.  Lifestyle modification discussed.       10/03/2021   10:11 AM 09/05/2021    8:51 AM  Depression screen PHQ 2/9  Decreased Interest 0 0  Down, Depressed, Hopeless 0 0  PHQ - 2 Score 0 0  Altered sleeping 0 0  Tired, decreased energy 0 0  Change in appetite 0 0  Feeling bad or failure about yourself  0 0  Trouble concentrating 0 0  Moving slowly or fidgety/restless 0 0  Suicidal thoughts 0 0  PHQ-9 Score 0 0  Difficult doing work/chores Not difficult at all Not difficult at all       No data to  display          Medications: Outpatient Medications Prior to Visit  Medication Sig   albuterol  (VENTOLIN  HFA) 108 (90 Base) MCG/ACT inhaler Inhale 1-2 puffs into the lungs every 6 (six) hours as needed for wheezing or shortness of breath.   amLODipine  (NORVASC ) 5 MG tablet TAKE 1 TABLET (5 MG TOTAL) BY MOUTH DAILY.   atorvastatin  (LIPITOR) 10 MG tablet Take 1 tablet (10 mg total) by mouth daily.   benzonatate  (TESSALON ) 100 MG capsule Take 100 mg by mouth 3 (three) times daily.   chlorpheniramine-HYDROcodone  (TUSSIONEX) 10-8 MG/5ML Take by mouth.   doxycycline  (VIBRAMYCIN ) 100 MG capsule Take 100 mg by mouth 2 (two) times daily.   loratadine  (CLARITIN ) 10 MG tablet Take 1 tablet (10 mg total) by mouth at bedtime as needed for allergies or rhinitis.   losartan -hydrochlorothiazide  (HYZAAR) 100-25 MG tablet Take 1 tablet by mouth daily.   metFORMIN  (GLUCOPHAGE ) 500 MG tablet Take 1 tablet (500 mg total) by mouth 2 (two) times daily with a meal.   predniSONE  (DELTASONE ) 20 MG tablet Take by mouth.   varenicline  (CHANTIX ) 1 MG tablet TAKE 1 TABLET BY MOUTH TWICE A DAY   No facility-administered medications prior to visit.    Review of Systems  All other systems reviewed and are negative.  All negative Except see HPI       Objective    There were no vitals taken for this visit.  Physical Exam Vitals reviewed.  Constitutional:      General: He is not in acute distress.    Appearance: Normal appearance. He is not diaphoretic.  HENT:     Head: Normocephalic and atraumatic.  Eyes:     General: No scleral icterus.    Conjunctiva/sclera: Conjunctivae normal.  Cardiovascular:     Rate and Rhythm: Normal rate and regular rhythm.     Pulses: Normal pulses.     Heart sounds: Normal heart sounds. No murmur heard. Pulmonary:     Effort: Pulmonary effort is normal. No respiratory distress.     Breath sounds: Normal breath sounds. No wheezing or rhonchi.  Musculoskeletal:      Cervical back: Neck supple.     Right lower leg: No edema.     Left lower leg: No edema.  Lymphadenopathy:     Cervical: No cervical adenopathy.  Skin:    General: Skin is warm and dry.     Findings: No rash.  Neurological:     Mental Status: He is alert and oriented to person, place, and time. Mental status is at baseline.  Psychiatric:        Mood and Affect: Mood normal.        Behavior: Behavior normal.      No results found for any visits on 02/10/23.      Assessment and Plan    No orders of the defined types were placed in this encounter.   No follow-ups on file.   The patient was advised to call back or seek an in-person evaluation if the symptoms worsen or if the condition fails to improve as anticipated.  I discussed the assessment and treatment plan with the patient. The patient was provided an opportunity to ask questions and all were answered. The patient agreed with the plan and demonstrated an understanding of the instructions.  I, Dhani Dannemiller, PA-C have reviewed all documentation for this visit. The documentation on 02/10/2023  for the exam, diagnosis, procedures, and orders are all accurate and complete.  Blane Bunting, [facility], MMS [employer] [phone] (phone) [phone] (fax)  [facility]

## 2023-02-10 ENCOUNTER — Ambulatory Visit: Payer: 59 | Admitting: Physician Assistant

## 2023-02-10 DIAGNOSIS — Z09 Encounter for follow-up examination after completed treatment for conditions other than malignant neoplasm: Secondary | ICD-10-CM

## 2023-02-10 DIAGNOSIS — I10 Essential (primary) hypertension: Secondary | ICD-10-CM

## 2023-02-10 DIAGNOSIS — F17200 Nicotine dependence, unspecified, uncomplicated: Secondary | ICD-10-CM

## 2023-02-10 DIAGNOSIS — J45909 Unspecified asthma, uncomplicated: Secondary | ICD-10-CM | POA: Diagnosis not present

## 2023-02-10 DIAGNOSIS — E119 Type 2 diabetes mellitus without complications: Secondary | ICD-10-CM | POA: Diagnosis not present

## 2023-02-13 ENCOUNTER — Encounter: Payer: Self-pay | Admitting: Physician Assistant

## 2023-02-13 ENCOUNTER — Ambulatory Visit: Payer: 59 | Admitting: Physician Assistant

## 2023-02-13 VITALS — BP 158/100 | HR 94 | Resp 18 | Ht 73.0 in | Wt 308.0 lb

## 2023-02-13 DIAGNOSIS — F17200 Nicotine dependence, unspecified, uncomplicated: Secondary | ICD-10-CM | POA: Diagnosis not present

## 2023-02-13 DIAGNOSIS — E7800 Pure hypercholesterolemia, unspecified: Secondary | ICD-10-CM

## 2023-02-13 DIAGNOSIS — E1169 Type 2 diabetes mellitus with other specified complication: Secondary | ICD-10-CM | POA: Diagnosis not present

## 2023-02-13 DIAGNOSIS — Z7984 Long term (current) use of oral hypoglycemic drugs: Secondary | ICD-10-CM

## 2023-02-13 DIAGNOSIS — E119 Type 2 diabetes mellitus without complications: Secondary | ICD-10-CM

## 2023-02-13 DIAGNOSIS — I10 Essential (primary) hypertension: Secondary | ICD-10-CM

## 2023-02-13 DIAGNOSIS — J45909 Unspecified asthma, uncomplicated: Secondary | ICD-10-CM | POA: Diagnosis not present

## 2023-02-13 MED ORDER — VARENICLINE TARTRATE 1 MG PO TABS: 1.0000 mg | ORAL_TABLET | Freq: Two times a day (BID) | ORAL | 1 refills | Status: DC

## 2023-02-13 MED ORDER — ALBUTEROL SULFATE HFA 108 (90 BASE) MCG/ACT IN AERS: 1.0000 | INHALATION_SPRAY | Freq: Four times a day (QID) | RESPIRATORY_TRACT | 0 refills | Status: DC | PRN

## 2023-02-13 MED ORDER — AMLODIPINE BESYLATE 5 MG PO TABS: 5.0000 mg | ORAL_TABLET | Freq: Every day | ORAL | 0 refills | Status: DC

## 2023-02-13 MED ORDER — FLUTICASONE-SALMETEROL 100-50 MCG/ACT IN AEPB: 1.0000 | INHALATION_SPRAY | Freq: Two times a day (BID) | RESPIRATORY_TRACT | 3 refills | Status: DC

## 2023-02-13 MED ORDER — LOSARTAN POTASSIUM-HCTZ 100-25 MG PO TABS: 1.0000 | ORAL_TABLET | Freq: Every day | ORAL | 0 refills | Status: AC

## 2023-02-13 NOTE — Progress Notes (Signed)
Established patient visit  Patient: Curtis [name]   DOB: [birth year]   48 y.o. Male  MRN: [id number] Visit Date: 02/13/2023  Today's healthcare provider: Debera Lat, PA-C   Chief Complaint  Patient presents with   Hypertension   Subjective       Discussed the use of AI scribe software for clinical note transcription with the patient, who gave verbal consent to proceed.  History of Present Illness   The patient, with a history of hypertension, asthma, and diabetes, presents for a medication refill and follow-up after a recent hospitalization due to an infection. He reports running out of his blood pressure medications, including hydrochlorothiazide, losartan, and amlodipine, and has not been monitoring his blood pressure since running out of medication. He denies any current symptoms such as leg swelling, chest pain, shortness of breath, rapid heart beating, visual problems, weakness, tingling, or numbness. He has been following up with ophthalmology due to visual impairment, with the last visit in September of the previous year.  The patient admits to smoking and is out of his smoking cessation medication, Chantix. He also reports being out of his inhaler. He is currently on metformin for diabetes and a cholesterol medication, both of which he believes should have refills available.  The patient's daughter accompanied him to the appointment and expressed concern about the patient's adherence to medication and smoking cessation.     At the beginning of January was hospitalized for RSV, acute asthma exacerbation      02/13/2023    8:11 AM 10/03/2021   10:11 AM 09/05/2021    8:51 AM  Depression screen PHQ 2/9  Decreased Interest 0 0 0  Down, Depressed, Hopeless 0 0 0  PHQ - 2 Score 0 0 0  Altered sleeping 2 0 0  Tired, decreased energy 2 0 0  Change in appetite 0 0 0  Feeling bad or failure about yourself  0 0 0  Trouble concentrating 0 0 0  Moving slowly or fidgety/restless  0 0 0  Suicidal thoughts 0 0 0  PHQ-9 Score 4 0 0  Difficult doing work/chores Not difficult at all Not difficult at all Not difficult at all      02/13/2023    8:11 AM  GAD 7 : Generalized Anxiety Score  Nervous, Anxious, on Edge 0  Control/stop worrying 0  Worry too much - different things 0  Trouble relaxing 0  Restless 0  Easily annoyed or irritable 0  Afraid - awful might happen 0  Total GAD 7 Score 0    Medications: Outpatient Medications Prior to Visit  Medication Sig   atorvastatin (LIPITOR) 10 MG tablet Take 1 tablet (10 mg total) by mouth daily.   metFORMIN (GLUCOPHAGE) 500 MG tablet Take 1 tablet (500 mg total) by mouth 2 (two) times daily with a meal.   predniSONE (DELTASONE) 20 MG tablet Take by mouth.   [DISCONTINUED] amLODipine (NORVASC) 5 MG tablet TAKE 1 TABLET (5 MG TOTAL) BY MOUTH DAILY.   loratadine (CLARITIN) 10 MG tablet Take 1 tablet (10 mg total) by mouth at bedtime as needed for allergies or rhinitis. (Patient not taking: Reported on 02/13/2023)   [DISCONTINUED] albuterol (VENTOLIN HFA) 108 (90 Base) MCG/ACT inhaler Inhale 1-2 puffs into the lungs every 6 (six) hours as needed for wheezing or shortness of breath. (Patient not taking: Reported on 02/13/2023)   [DISCONTINUED] benzonatate (TESSALON) 100 MG capsule Take 100 mg by mouth 3 (three) times daily. (Patient not taking: Reported on  02/13/2023)   [DISCONTINUED] chlorpheniramine-HYDROcodone (TUSSIONEX) 10-8 MG/5ML Take by mouth. (Patient not taking: Reported on 02/13/2023)   [DISCONTINUED] doxycycline (VIBRAMYCIN) 100 MG capsule Take 100 mg by mouth 2 (two) times daily.   [DISCONTINUED] losartan-hydrochlorothiazide (HYZAAR) 100-25 MG tablet Take 1 tablet by mouth daily.   [DISCONTINUED] varenicline (CHANTIX) 1 MG tablet TAKE 1 TABLET BY MOUTH TWICE A DAY (Patient not taking: Reported on 02/13/2023)   No facility-administered medications prior to visit.    Review of Systems All negative Except see HPI        Objective    BP (!) 158/100   Pulse 94   Resp 18   Ht 6\' 1"  (1.854 m)   Wt (!) 308 lb (139.7 kg)   SpO2 95%   BMI 40.64 kg/m     Physical Exam Vitals reviewed.  Constitutional:      General: He is not in acute distress.    Appearance: Normal appearance. He is obese. He is not diaphoretic.  HENT:     Head: Normocephalic and atraumatic.  Eyes:     General: No scleral icterus.    Conjunctiva/sclera: Conjunctivae normal.  Cardiovascular:     Rate and Rhythm: Normal rate and regular rhythm.     Pulses: Normal pulses.     Heart sounds: Normal heart sounds. No murmur heard. Pulmonary:     Effort: Pulmonary effort is normal. No respiratory distress.     Breath sounds: Normal breath sounds. No wheezing or rhonchi.  Musculoskeletal:     Cervical back: Neck supple.     Right lower leg: No edema.     Left lower leg: No edema.  Lymphadenopathy:     Cervical: No cervical adenopathy.  Skin:    General: Skin is warm and dry.     Findings: No rash.  Neurological:     Mental Status: He is alert and oriented to person, place, and time. Mental status is at baseline.  Psychiatric:        Mood and Affect: Mood normal.        Behavior: Behavior normal.      No results found for any visits on 02/13/23.          Asthma, unspecified asthma severity, unspecified whether complicated, unspecified whether persistent - albuterol (VENTOLIN HFA) 108 (90 Base) MCG/ACT inhaler; Inhale 1-2 puffs into the lungs every 6 (six) hours as needed for wheezing or shortness of breath.  Dispense: 18 g; Refill: 0 - fluticasone-salmeterol (ADVAIR) 100-50 MCG/ACT AEPB; Inhale 1 puff into the lungs 2 (two) times daily.  Dispense: 1 each; Refill: 3   Smoking Smoking cessation was addressed  Patient was advised to quit smoking  Start back chantix - varenicline (CHANTIX) 1 MG tablet; Take 1 tablet (1 mg total) by mouth 2 (two) times daily.  Dispense: 168 tablet; Refill: 1  Hypertension,  unspecified type - amLODipine (NORVASC) 5 MG tablet; Take 1 tablet (5 mg total) by mouth daily.  Dispense: 90 tablet; Refill: 0 - losartan-hydrochlorothiazide (HYZAAR) 100-25 MG tablet; Take 1 tablet by mouth daily.  Dispense: 90 tablet; Refill: 0  Hypertension chronic and unstable Patient has been non-compliant with medications (Hydrochlorothiazide, Losartan, Amlodipine). No current symptoms of hypertensive crisis reported. -Resume Hydrochlorothiazide, Losartan, and Amlodipine as previously prescribed. -Encourage patient to monitor blood pressure at home and bring records to next appointment.  Asthma chronic and stable? Patient has been non-compliant with inhaler use and continues to smoke. -Resume use of albuterol inhaler as needed for shortness of  breath, coughing, wheezing. -Consider adding Advair if approved by insurance. -Start Chantix to aid in smoking cessation.  Smoking   Hyperlipidemia Patient has been non-compliant with cholesterol medication. -Resume cholesterol medication as previously prescribed.  Diabetes chronic Patient has been non-compliant with Metformin. -Resume Metformin as previously prescribed. continue with low carb diet and exercise  Follow-up -See patient in 2 weeks with all medications and blood pressure records. -If patient experiences any unusual symptoms, particularly related to asthma, patient should seek medical attention sooner. Will proceed with the labs  No orders of the defined types were placed in this encounter.   Return in about 2 weeks (around 02/27/2023) for chronic disease f/u, BP f/u.   The patient was advised to call back or seek an in-person evaluation if the symptoms worsen or if the condition fails to improve as anticipated.  I discussed the assessment and treatment plan with the patient. The patient was provided an opportunity to ask questions and all were answered. The patient agreed with the plan and demonstrated an  understanding of the instructions.  I, Debera Lat, PA-C have reviewed all documentation for this visit. The documentation on 02/13/2023  for the exam, diagnosis, procedures, and orders are all accurate and complete.  Debera Lat, [facility], MMS [facility] [phone] (phone) [phone] (fax)  [facility]

## 2023-02-27 ENCOUNTER — Ambulatory Visit: Payer: Self-pay | Admitting: Physician Assistant

## 2023-03-05 ENCOUNTER — Ambulatory Visit: Payer: 59 | Admitting: Physician Assistant

## 2023-03-19 ENCOUNTER — Encounter: Payer: Self-pay | Admitting: Physician Assistant

## 2023-03-19 ENCOUNTER — Ambulatory Visit: Payer: 59 | Admitting: Physician Assistant

## 2023-03-19 VITALS — BP 135/76 | HR 108 | Ht 73.0 in | Wt 313.7 lb

## 2023-03-19 DIAGNOSIS — E1169 Type 2 diabetes mellitus with other specified complication: Secondary | ICD-10-CM

## 2023-03-19 DIAGNOSIS — E7800 Pure hypercholesterolemia, unspecified: Secondary | ICD-10-CM

## 2023-03-19 DIAGNOSIS — I10 Essential (primary) hypertension: Secondary | ICD-10-CM | POA: Diagnosis not present

## 2023-03-19 DIAGNOSIS — J45909 Unspecified asthma, uncomplicated: Secondary | ICD-10-CM | POA: Diagnosis not present

## 2023-03-19 DIAGNOSIS — F17200 Nicotine dependence, unspecified, uncomplicated: Secondary | ICD-10-CM

## 2023-03-19 DIAGNOSIS — Z6841 Body Mass Index (BMI) 40.0 and over, adult: Secondary | ICD-10-CM

## 2023-03-19 DIAGNOSIS — E119 Type 2 diabetes mellitus without complications: Secondary | ICD-10-CM

## 2023-03-19 DIAGNOSIS — E669 Obesity, unspecified: Secondary | ICD-10-CM

## 2023-03-19 DIAGNOSIS — K219 Gastro-esophageal reflux disease without esophagitis: Secondary | ICD-10-CM

## 2023-03-19 MED ORDER — OMEPRAZOLE 20 MG PO CPDR: 20.0000 mg | DELAYED_RELEASE_CAPSULE | Freq: Two times a day (BID) | ORAL | 1 refills | Status: DC

## 2023-03-19 MED ORDER — ALBUTEROL SULFATE HFA 108 (90 BASE) MCG/ACT IN AERS: 1.0000 | INHALATION_SPRAY | Freq: Four times a day (QID) | RESPIRATORY_TRACT | 0 refills | Status: DC | PRN

## 2023-03-19 NOTE — Progress Notes (Unsigned)
 Established patient visit  Patient: Curtis [name]   DOB: [birth year]   47 y.o. Male  MRN: [id number] Visit Date: 03/19/2023  Today's healthcare provider: Debera Lat, PA-C   No chief complaint on file.  Subjective       Discussed the use of AI scribe software for clinical note transcription with the patient, who gave verbal consent to proceed.  History of Present Illness         Labs pending      02/13/2023    8:11 AM 10/03/2021   10:11 AM 09/05/2021    8:51 AM  Depression screen PHQ 2/9  Decreased Interest 0 0 0  Down, Depressed, Hopeless 0 0 0  PHQ - 2 Score 0 0 0  Altered sleeping 2 0 0  Tired, decreased energy 2 0 0  Change in appetite 0 0 0  Feeling bad or failure about yourself  0 0 0  Trouble concentrating 0 0 0  Moving slowly or fidgety/restless 0 0 0  Suicidal thoughts 0 0 0  PHQ-9 Score 4 0 0  Difficult doing work/chores Not difficult at all Not difficult at all Not difficult at all      02/13/2023    8:11 AM  GAD 7 : Generalized Anxiety Score  Nervous, Anxious, on Edge 0  Control/stop worrying 0  Worry too much - different things 0  Trouble relaxing 0  Restless 0  Easily annoyed or irritable 0  Afraid - awful might happen 0  Total GAD 7 Score 0    Medications: Outpatient Medications Prior to Visit  Medication Sig   albuterol (VENTOLIN HFA) 108 (90 Base) MCG/ACT inhaler Inhale 1-2 puffs into the lungs every 6 (six) hours as needed for wheezing or shortness of breath.   amLODipine (NORVASC) 5 MG tablet Take 1 tablet (5 mg total) by mouth daily.   atorvastatin (LIPITOR) 10 MG tablet Take 1 tablet (10 mg total) by mouth daily.   fluticasone-salmeterol (ADVAIR) 100-50 MCG/ACT AEPB Inhale 1 puff into the lungs 2 (two) times daily.   loratadine (CLARITIN) 10 MG tablet Take 1 tablet (10 mg total) by mouth at bedtime as needed for allergies or rhinitis. (Patient not taking: Reported on 02/13/2023)   losartan-hydrochlorothiazide (HYZAAR) 100-25 MG  tablet Take 1 tablet by mouth daily.   metFORMIN (GLUCOPHAGE) 500 MG tablet Take 1 tablet (500 mg total) by mouth 2 (two) times daily with a meal.   predniSONE (DELTASONE) 20 MG tablet Take by mouth.   varenicline (CHANTIX) 1 MG tablet Take 1 tablet (1 mg total) by mouth 2 (two) times daily.   No facility-administered medications prior to visit.    Review of Systems  All other systems reviewed and are negative.  All negative Except see HPI   {Insert previous labs (optional):23779} {See past labs  Heme  Chem  Endocrine  Serology  Results Review (optional):1}   Objective    There were no vitals taken for this visit. {Insert last BP/Wt (optional):23777}{See vitals history (optional):1}   Physical Exam Vitals reviewed.  Constitutional:      General: He is not in acute distress.    Appearance: Normal appearance. He is not diaphoretic.  HENT:     Head: Normocephalic and atraumatic.  Eyes:     General: No scleral icterus.    Conjunctiva/sclera: Conjunctivae normal.  Cardiovascular:     Rate and Rhythm: Normal rate and regular rhythm.     Pulses: Normal pulses.     Heart sounds: Normal  heart sounds. No murmur heard. Pulmonary:     Effort: Pulmonary effort is normal. No respiratory distress.     Breath sounds: Normal breath sounds. No wheezing or rhonchi.  Musculoskeletal:     Cervical back: Neck supple.     Right lower leg: No edema.     Left lower leg: No edema.  Lymphadenopathy:     Cervical: No cervical adenopathy.  Skin:    General: Skin is warm and dry.     Findings: No rash.  Neurological:     Mental Status: He is alert and oriented to person, place, and time. Mental status is at baseline.  Psychiatric:        Mood and Affect: Mood normal.        Behavior: Behavior normal.      No results found for any visits on 03/19/23.      Assessment and Plan             No orders of the defined types were placed in this encounter.   No follow-ups on file.    The patient was advised to call back or seek an in-person evaluation if the symptoms worsen or if the condition fails to improve as anticipated.  I discussed the assessment and treatment plan with the patient. The patient was provided an opportunity to ask questions and all were answered. The patient agreed with the plan and demonstrated an understanding of the instructions.  I, Debera Lat, PA-C have reviewed all documentation for this visit. The documentation on 03/19/2023  for the exam, diagnosis, procedures, and orders are all accurate and complete.  Debera Lat, [facility], MMS [facility] [phone] (phone) [phone] (fax)  [facility]

## 2023-03-26 ENCOUNTER — Encounter: Payer: Self-pay | Admitting: Physician Assistant

## 2023-03-27 ENCOUNTER — Encounter: Payer: Self-pay | Admitting: Physician Assistant

## 2023-03-27 LAB — COMPREHENSIVE METABOLIC PANEL
ALT: 30 IU/L (ref 0–44)
AST: 25 IU/L (ref 0–40)
Albumin: 4.1 g/dL (ref 4.1–5.1)
Alkaline Phosphatase: 110 IU/L (ref 44–121)
BUN/Creatinine Ratio: 10 (ref 9–20)
BUN: 10 mg/dL (ref 6–24)
Bilirubin Total: 0.3 mg/dL (ref 0.0–1.2)
CO2: 24 mmol/L (ref 20–29)
Calcium: 9.5 mg/dL (ref 8.7–10.2)
Chloride: 102 mmol/L (ref 96–106)
Creatinine, Ser: 1.03 mg/dL (ref 0.76–1.27)
Globulin, Total: 2.7 g/dL (ref 1.5–4.5)
Glucose: 109 mg/dL — ABNORMAL HIGH (ref 70–99)
Potassium: 4.4 mmol/L (ref 3.5–5.2)
Sodium: 142 mmol/L (ref 134–144)
Total Protein: 6.8 g/dL (ref 6.0–8.5)
eGFR: 90 mL/min/{1.73_m2} (ref >59)

## 2023-03-27 LAB — LIPID PANEL
Chol/HDL Ratio: 3.8 ratio (ref 0.0–5.0)
Cholesterol, Total: 202 mg/dL — ABNORMAL HIGH (ref 100–199)
HDL: 53 mg/dL (ref >39)
LDL Chol Calc (NIH): 115 mg/dL — ABNORMAL HIGH (ref 0–99)
Triglycerides: 193 mg/dL — ABNORMAL HIGH (ref 0–149)
VLDL Cholesterol Cal: 34 mg/dL (ref 5–40)

## 2023-03-27 LAB — CBC WITH DIFFERENTIAL/PLATELET
Basophils Absolute: 0.1 x10E3/uL (ref 0.0–0.2)
Basos: 0 %
EOS (ABSOLUTE): 0.2 x10E3/uL (ref 0.0–0.4)
Eos: 1 %
Hematocrit: 44 % (ref 37.5–51.0)
Hemoglobin: 14.1 g/dL (ref 13.0–17.7)
Immature Grans (Abs): 0 x10E3/uL (ref 0.0–0.1)
Immature Granulocytes: 0 %
Lymphocytes Absolute: 2.7 x10E3/uL (ref 0.7–3.1)
Lymphs: 23 %
MCH: 26.5 pg — ABNORMAL LOW (ref 26.6–33.0)
MCHC: 32 g/dL (ref 31.5–35.7)
MCV: 83 fL (ref 79–97)
Monocytes Absolute: 1 x10E3/uL — ABNORMAL HIGH (ref 0.1–0.9)
Monocytes: 8 %
Neutrophils Absolute: 8.1 x10E3/uL — ABNORMAL HIGH (ref 1.4–7.0)
Neutrophils: 68 %
Platelets: 378 x10E3/uL (ref 150–450)
RBC: 5.32 x10E6/uL (ref 4.14–5.80)
RDW: 14.9 % (ref 11.6–15.4)
WBC: 12.1 x10E3/uL — ABNORMAL HIGH (ref 3.4–10.8)

## 2023-03-27 LAB — HEMOGLOBIN A1C
Est. average glucose Bld gHb Est-mCnc: 151 mg/dL
Hgb A1c MFr Bld: 6.9 % — ABNORMAL HIGH (ref 4.8–5.6)

## 2023-03-27 LAB — TSH: TSH: 0.988 u[IU]/mL (ref 0.450–4.500)

## 2023-03-30 NOTE — Progress Notes (Deleted)
 Established patient visit  Patient: Curtis [name]   DOB: 17-Nov-1975   48 y.o. Male  MRN: [id number] Visit Date: 04/02/2023  Today's healthcare provider: Debera Lat, PA-C   No chief complaint on file.  Subjective       Discussed the use of AI scribe software for clinical note transcription with the patient, who gave verbal consent to proceed.  History of Present Illness               02/13/2023    8:11 AM 10/03/2021   10:11 AM 09/05/2021    8:51 AM  Depression screen PHQ 2/9  Decreased Interest 0 0 0  Down, Depressed, Hopeless 0 0 0  PHQ - 2 Score 0 0 0  Altered sleeping 2 0 0  Tired, decreased energy 2 0 0  Change in appetite 0 0 0  Feeling bad or failure about yourself  0 0 0  Trouble concentrating 0 0 0  Moving slowly or fidgety/restless 0 0 0  Suicidal thoughts 0 0 0  PHQ-9 Score 4 0 0  Difficult doing work/chores Not difficult at all Not difficult at all Not difficult at all      02/13/2023    8:11 AM  GAD 7 : Generalized Anxiety Score  Nervous, Anxious, on Edge 0  Control/stop worrying 0  Worry too much - different things 0  Trouble relaxing 0  Restless 0  Easily annoyed or irritable 0  Afraid - awful might happen 0  Total GAD 7 Score 0    Medications: Outpatient Medications Prior to Visit  Medication Sig  . albuterol (VENTOLIN HFA) 108 (90 Base) MCG/ACT inhaler Inhale 1-2 puffs into the lungs every 6 (six) hours as needed for wheezing or shortness of breath.  Marland Kitchen amLODipine (NORVASC) 5 MG tablet Take 1 tablet (5 mg total) by mouth daily.  Marland Kitchen atorvastatin (LIPITOR) 10 MG tablet Take 1 tablet (10 mg total) by mouth daily.  . fluticasone-salmeterol (ADVAIR) 100-50 MCG/ACT AEPB Inhale 1 puff into the lungs 2 (two) times daily.  Marland Kitchen loratadine (CLARITIN) 10 MG tablet Take 1 tablet (10 mg total) by mouth at bedtime as needed for allergies or rhinitis.  Marland Kitchen losartan-hydrochlorothiazide (HYZAAR) 100-25 MG tablet Take 1 tablet by mouth daily.  . metFORMIN  (GLUCOPHAGE) 500 MG tablet Take 1 tablet (500 mg total) by mouth 2 (two) times daily with a meal.  . omeprazole (PRILOSEC) 20 MG capsule Take 1 capsule (20 mg total) by mouth 2 (two) times daily before a meal.  . predniSONE (DELTASONE) 20 MG tablet Take by mouth.  . varenicline (CHANTIX) 1 MG tablet Take 1 tablet (1 mg total) by mouth 2 (two) times daily.   No facility-administered medications prior to visit.    Review of Systems  All other systems reviewed and are negative. All negative Except see HPI   {Insert previous labs (optional):23779} {See past labs  Heme  Chem  Endocrine  Serology  Results Review (optional):1}   Objective    There were no vitals taken for this visit. {Insert last BP/Wt (optional):23777}{See vitals history (optional):1}   Physical Exam Vitals reviewed.  Constitutional:      General: He is not in acute distress.    Appearance: Normal appearance. He is not diaphoretic.  HENT:     Head: Normocephalic and atraumatic.  Eyes:     General: No scleral icterus.    Conjunctiva/sclera: Conjunctivae normal.  Cardiovascular:     Rate and Rhythm: Normal rate and regular rhythm.  Pulses: Normal pulses.     Heart sounds: Normal heart sounds. No murmur heard. Pulmonary:     Effort: Pulmonary effort is normal. No respiratory distress.     Breath sounds: Normal breath sounds. No wheezing or rhonchi.  Musculoskeletal:     Cervical back: Neck supple.     Right lower leg: No edema.     Left lower leg: No edema.  Lymphadenopathy:     Cervical: No cervical adenopathy.  Skin:    General: Skin is warm and dry.     Findings: No rash.  Neurological:     Mental Status: He is alert and oriented to person, place, and time. Mental status is at baseline.  Psychiatric:        Mood and Affect: Mood normal.        Behavior: Behavior normal.     No results found for any visits on 04/02/23.      Assessment and Plan             No orders of the defined  types were placed in this encounter.   No follow-ups on file.   The patient was advised to call back or seek an in-person evaluation if the symptoms worsen or if the condition fails to improve as anticipated.  I discussed the assessment and treatment plan with the patient. The patient was provided an opportunity to ask questions and all were answered. The patient agreed with the plan and demonstrated an understanding of the instructions.  I, Debera Lat, PA-C have reviewed all documentation for this visit. The documentation on 04/02/2023  for the exam, diagnosis, procedures, and orders are all accurate and complete.  Debera Lat, [facility], MMS [facility] [phone] (phone) [phone] (fax)  [facility]

## 2023-04-02 ENCOUNTER — Ambulatory Visit: Admitting: Physician Assistant

## 2023-04-02 DIAGNOSIS — I10 Essential (primary) hypertension: Secondary | ICD-10-CM

## 2023-04-02 DIAGNOSIS — E119 Type 2 diabetes mellitus without complications: Secondary | ICD-10-CM

## 2023-04-02 DIAGNOSIS — E7800 Pure hypercholesterolemia, unspecified: Secondary | ICD-10-CM

## 2023-04-02 DIAGNOSIS — F17200 Nicotine dependence, unspecified, uncomplicated: Secondary | ICD-10-CM

## 2023-04-02 DIAGNOSIS — Z789 Other specified health status: Secondary | ICD-10-CM

## 2023-04-11 ENCOUNTER — Other Ambulatory Visit: Payer: Self-pay | Admitting: Physician Assistant

## 2023-04-11 DIAGNOSIS — K219 Gastro-esophageal reflux disease without esophagitis: Secondary | ICD-10-CM

## 2023-04-14 NOTE — Telephone Encounter (Signed)
 Requested Prescriptions  Pending Prescriptions Disp Refills   omeprazole (PRILOSEC) 20 MG capsule [Pharmacy Med Name: OMEPRAZOLE DR 20 MG CAPSULE] 180 capsule 0    Sig: TAKE 1 CAPSULE (20 MG TOTAL) BY MOUTH 2 (TWO) TIMES DAILY BEFORE A MEAL.     Gastroenterology: Proton Pump Inhibitors Passed - 04/14/2023  2:17 PM      Passed - Valid encounter within last 12 months    Recent Outpatient Visits           3 weeks ago Primary hypertension   [facility] [place], [place], [place]

## 2023-05-05 ENCOUNTER — Other Ambulatory Visit: Payer: Self-pay | Admitting: Physician Assistant

## 2023-05-05 DIAGNOSIS — Z716 Tobacco abuse counseling: Secondary | ICD-10-CM

## 2023-05-07 NOTE — Telephone Encounter (Signed)
 Chantix  is not covered by your insurance, please, try this medication. It could be helpful with weight loss and with smoking. If you agree, med was called in

## 2023-06-02 ENCOUNTER — Emergency Department
Admission: EM | Admit: 2023-06-02 | Discharge: 2023-06-02 | Disposition: A | Discharge status: Home / Self Care | Attending: Emergency Medicine | Admitting: Emergency Medicine

## 2023-06-02 ENCOUNTER — Encounter: Payer: Self-pay | Admitting: Emergency Medicine

## 2023-06-02 ENCOUNTER — Other Ambulatory Visit: Payer: Self-pay

## 2023-06-02 DIAGNOSIS — S3992XA Unspecified injury of lower back, initial encounter: Secondary | ICD-10-CM | POA: Diagnosis present

## 2023-06-02 DIAGNOSIS — X509XXA Other and unspecified overexertion or strenuous movements or postures, initial encounter: Secondary | ICD-10-CM | POA: Diagnosis not present

## 2023-06-02 DIAGNOSIS — J45909 Unspecified asthma, uncomplicated: Secondary | ICD-10-CM | POA: Diagnosis not present

## 2023-06-02 DIAGNOSIS — I10 Essential (primary) hypertension: Secondary | ICD-10-CM | POA: Insufficient documentation

## 2023-06-02 DIAGNOSIS — S39012A Strain of muscle, fascia and tendon of lower back, initial encounter: Secondary | ICD-10-CM | POA: Diagnosis not present

## 2023-06-02 HISTORY — DX: Essential (primary) hypertension: I10

## 2023-06-02 MED ORDER — KETOROLAC TROMETHAMINE 15 MG/ML IJ SOLN
15.0000 mg | Freq: Once | INTRAMUSCULAR | Status: AC
  Administered 2023-06-02: 15 mg via INTRAMUSCULAR
  Filled 2023-06-02: qty 1

## 2023-06-02 NOTE — ED Notes (Signed)
 This RN reviewed paperwork with pt. Pt educated on need to take bp meds. No further complaints or questions. Pt ambulated to lobby.

## 2023-06-02 NOTE — ED Triage Notes (Signed)
 Pt to ED via POV. Pt states that he was shaking a trash bag and when he stood back up his started having pain in his lower back. Pt is walking with a limp.

## 2023-06-02 NOTE — Discharge Instructions (Addendum)
We believe that your symptoms are caused by musculoskeletal strain.  Please read through the included information about additional care such as heating pads, over-the-counter pain medicine. Remember that early mobility and using the affected part of your body is actually better than keeping it immobile.  Follow-up with the doctor listed as recommended or return to the emergency department with new or worsening symptoms that concern you.  

## 2023-06-02 NOTE — ED Provider Notes (Addendum)
 [facility] Provider Note    Event Date/Time   First MD Initiated Contact with Patient 06/02/23 1415     (approximate)   History   Back Pain   HPI Curtis [name] is a 48 y.o. male presenting to the emergency department for lower back pain.  States he was bent over and shaking a trash bag and felt pain in his lower back when he tried to stand up. Has taken 2 aleve without relief.  Denies nausea, vomiting, chest pain, fever, red flags of back pain.  Past medical history includes hypertension, asthma.  States he has been working since 2 AM and was not able to take his blood pressure medications this morning.  No history of prior back surgeries or back problems. He does smoke cigarettes and drink alcohol. No allergies.  Physical Exam   Triage Vital Signs: ED Triage Vitals  Encounter Vitals Group     BP 06/02/23 1340 (!) 182/110     Systolic BP Percentile --      Diastolic BP Percentile --      Pulse Rate 06/02/23 1340 (!) 108     Resp 06/02/23 1340 16     Temp 06/02/23 1340 98.5 F (36.9 C)     Temp Source 06/02/23 1340 Oral     SpO2 06/02/23 1340 99 %     Weight 06/02/23 1339 (!) 313 lb 0.9 oz (142 kg)     Height 06/02/23 1339 6\' 1"  (1.854 m)     Head Circumference --      Peak Flow --      Pain Score 06/02/23 1339 10     Pain Loc --      Pain Education --      Exclude from Growth Chart --     Most recent vital signs: Vitals:   06/02/23 1340 06/02/23 1435  BP: (!) 182/110 (!) 185/101  Pulse: (!) 108 98  Resp: 16 18  Temp: 98.5 F (36.9 C)   SpO2: 99% 99%   General: Well-appearing, in no acute distress. Appears stated age. Head: Normocephalic, atraumatic. Neck: Supple, no lymphadenopathy, no JVD, no nuchal rigidity. CV: Regular rate and rhythm. No murmurs, rubs, or gallops. Peripheral pulses 2+ and symmetric. No edema. Respiratory: Breath sounds clear b/l. No wheezes, rales, or rhonchi. No respiratory distress. Normal respiratory  effort. GI: Soft, non-distended, non-tender. No rebound or guarding.  MSK: Tender to palpation in lumbar region.  Worse with lumbar flexion and twisting.  Normal range of motion in bilateral lower extremities.  He is ambulatory with a limp. Skin:Warm, dry, intact. No rashes, lesions, or ecchymosis. No cyanosis or pallor. Neurological: A&Ox4 to person, place, time, and situation.  Psychiatric: Mood and affect appropriate. Thought processes coherent.    ED Results / Procedures / Treatments   Labs (all labs ordered are listed, but only abnormal results are displayed) Labs Reviewed - No data to display   EKG  Not ordered.   RADIOLOGY  Not ordered.  PROCEDURES:  Critical Care performed: No  Procedures None   MEDICATIONS ORDERED IN ED: Medications  ketorolac  (TORADOL ) 15 MG/ML injection 15 mg (15 mg Intramuscular Given 06/02/23 1434)     IMPRESSION / MDM / ASSESSMENT AND PLAN / ED COURSE  I reviewed the triage vital signs and the nursing notes.  Differential diagnosis includes, but is not limited to, lumbar strain, vertebral fracture, herniated disc, cauda equina syndrome  Patient's presentation is most consistent with acute, uncomplicated illness.  Patient presented today with lower back pain that is likely due to a strain of his lumbar region following a mechanical mechanism of injury.  No red flag symptoms of back pain. No obvious deformities. Pain is reproducible with lumbar flexion. His blood pressure is elevated at 182/110, I ordered repeat vitals and shows 185/101.  This is likely due to his acute pain and not taking his scheduled amlodipine  and losartan -hydrochlorothiazide  this morning.  He was given 15 mg injection of Toradol  here. Given instructions on MSK care.  Emergency department return precautions were discussed with the patient.  Patient is in agreement to the treatment plan.  Patient is stable for discharge.   FINAL CLINICAL  IMPRESSION(S) / ED DIAGNOSES   Final diagnoses:  Strain of lumbar region, initial encounter     Rx / DC Orders   ED Discharge Orders     None        Note:  This document was prepared using Dragon voice recognition software and may include unintentional dictation errors.    Thomasenia Flesher, PA-C 06/02/23 1451    [address], [place], PA-C 06/02/23 1454    Bryson Carbine, MD 06/02/23 1524

## 2023-06-06 ENCOUNTER — Inpatient Hospital Stay: Admitting: Physician Assistant

## 2023-07-14 ENCOUNTER — Encounter: Payer: Self-pay | Admitting: Emergency Medicine

## 2023-07-14 ENCOUNTER — Emergency Department

## 2023-07-14 ENCOUNTER — Emergency Department
Admission: EM | Admit: 2023-07-14 | Discharge: 2023-07-14 | Disposition: A | Discharge status: Home / Self Care | Attending: Emergency Medicine | Admitting: Emergency Medicine

## 2023-07-14 ENCOUNTER — Other Ambulatory Visit: Payer: Self-pay

## 2023-07-14 DIAGNOSIS — J40 Bronchitis, not specified as acute or chronic: Secondary | ICD-10-CM | POA: Insufficient documentation

## 2023-07-14 DIAGNOSIS — I10 Essential (primary) hypertension: Secondary | ICD-10-CM | POA: Diagnosis not present

## 2023-07-14 DIAGNOSIS — D72829 Elevated white blood cell count, unspecified: Secondary | ICD-10-CM | POA: Insufficient documentation

## 2023-07-14 DIAGNOSIS — R059 Cough, unspecified: Secondary | ICD-10-CM | POA: Diagnosis present

## 2023-07-14 DIAGNOSIS — F17200 Nicotine dependence, unspecified, uncomplicated: Secondary | ICD-10-CM | POA: Insufficient documentation

## 2023-07-14 DIAGNOSIS — E119 Type 2 diabetes mellitus without complications: Secondary | ICD-10-CM | POA: Diagnosis not present

## 2023-07-14 DIAGNOSIS — Z79899 Other long term (current) drug therapy: Secondary | ICD-10-CM | POA: Insufficient documentation

## 2023-07-14 DIAGNOSIS — Z7984 Long term (current) use of oral hypoglycemic drugs: Secondary | ICD-10-CM | POA: Insufficient documentation

## 2023-07-14 LAB — BASIC METABOLIC PANEL WITH GFR
Anion gap: 11 (ref 5–15)
BUN: 17 mg/dL (ref 6–20)
CO2: 23 mmol/L (ref 22–32)
Calcium: 8.8 mg/dL — ABNORMAL LOW (ref 8.9–10.3)
Chloride: 106 mmol/L (ref 98–111)
Creatinine, Ser: 1.15 mg/dL (ref 0.61–1.24)
GFR, Estimated: >60 mL/min (ref >60)
Glucose, Bld: 143 mg/dL — ABNORMAL HIGH (ref 70–99)
Potassium: 3.8 mmol/L (ref 3.5–5.1)
Sodium: 140 mmol/L (ref 135–145)

## 2023-07-14 LAB — CBC
HCT: 41.9 % (ref 39.0–52.0)
Hemoglobin: 13.5 g/dL (ref 13.0–17.0)
MCH: 26.7 pg (ref 26.0–34.0)
MCHC: 32.2 g/dL (ref 30.0–36.0)
MCV: 82.8 fL (ref 80.0–100.0)
Platelets: 349 10*3/uL (ref 150–400)
RBC: 5.06 MIL/uL (ref 4.22–5.81)
RDW: 16.8 % — ABNORMAL HIGH (ref 11.5–15.5)
WBC: 14.3 10*3/uL — ABNORMAL HIGH (ref 4.0–10.5)
nRBC: 0 % (ref 0.0–0.2)

## 2023-07-14 MED ORDER — BENZONATATE 100 MG PO CAPS
200.0000 mg | ORAL_CAPSULE | Freq: Once | ORAL | Status: AC
  Administered 2023-07-14: 200 mg via ORAL
  Filled 2023-07-14: qty 2

## 2023-07-14 MED ORDER — ALBUTEROL SULFATE HFA 108 (90 BASE) MCG/ACT IN AERS
2.0000 | INHALATION_SPRAY | Freq: Once | RESPIRATORY_TRACT | Status: AC
  Administered 2023-07-14: 2 via RESPIRATORY_TRACT
  Filled 2023-07-14: qty 6.7

## 2023-07-14 MED ORDER — PREDNISONE 20 MG PO TABS: ORAL_TABLET | ORAL | 0 refills | Status: DC

## 2023-07-14 MED ORDER — HYDROCOD POLI-CHLORPHE POLI ER 10-8 MG/5ML PO SUER: 5.0000 mL | Freq: Two times a day (BID) | ORAL | 0 refills | Status: DC | PRN

## 2023-07-14 MED ORDER — PREDNISONE 20 MG PO TABS
60.0000 mg | ORAL_TABLET | Freq: Once | ORAL | Status: AC
  Administered 2023-07-14: 60 mg via ORAL
  Filled 2023-07-14: qty 3

## 2023-07-14 MED ORDER — AZITHROMYCIN 250 MG PO TABS: 250.0000 mg | ORAL_TABLET | Freq: Every day | ORAL | 0 refills | Status: DC

## 2023-07-14 MED ORDER — AZITHROMYCIN 500 MG PO TABS
500.0000 mg | ORAL_TABLET | Freq: Once | ORAL | Status: AC
  Administered 2023-07-14: 500 mg via ORAL
  Filled 2023-07-14: qty 1

## 2023-07-14 NOTE — ED Triage Notes (Signed)
 Pt to ED from home c/o SOB worse with lying flat, chest congestion and productive yellow cough getting worse over the last week.  States hx of bronchitis.

## 2023-07-14 NOTE — Discharge Instructions (Signed)
 Take and finish antibiotic and steroid as prescribed.  You may take Tussionex twice daily as needed for cough.  Use albuterol  inhaler 2 puffs every 4 hours as needed for cough, wheezing or difficulty breathing.  Keep an eye on your blood pressure and do not take decongestants as these can elevate your blood pressure.  Return to the ER for worsening symptoms, persistent vomiting, difficulty breathing or other concerns.

## 2023-07-14 NOTE — ED Provider Notes (Signed)
 [facility] Provider Note    Event Date/Time   First MD Initiated Contact with Patient 07/14/23 [phone]     (approximate)   History   Shortness of Breath and Nasal Congestion   HPI  Curtis [name] is a 48 y.o. male who presents to the ED from home with a chief complaint of cough, congestion and shortness of breath times several days.  History of bronchitis. + Smoker.  Endorses occasional wheezing on coughing.  Denies associated fever/chills, chest pain, abdominal pain, nausea, vomiting or dizziness.     Past Medical History   Past Medical History:  Diagnosis Date   Allergies    Hypertension      Active Problem List   Patient Active Problem List   Diagnosis Date Noted   Acute bronchitis with bronchospasm 01/16/2023   Asthma 01/16/2023   Adenomatous polyp of colon    Annual physical exam 10/05/2021   Smoking 10/05/2021   Encounter for screening for HIV 10/05/2021   Need for hepatitis C screening test 10/05/2021   Screen for colon cancer 10/05/2021   2+ pitting edema 10/05/2021   Morbid obesity (HCC) 09/19/2021   Hypertension 09/19/2021   Dyspnea 09/19/2021   Leg swelling 09/19/2021     Past Surgical History   Past Surgical History:  Procedure Laterality Date   COLONOSCOPY WITH PROPOFOL  N/A 11/08/2021   Procedure: COLONOSCOPY WITH PROPOFOL ;  Surgeon: Therisa Bi, MD;  Location: [facility] ENDOSCOPY;  Service: Gastroenterology;  Laterality: N/A;     Home Medications   Prior to Admission medications   Medication Sig Start Date End Date Taking? Authorizing Provider  azithromycin (ZITHROMAX) 250 MG tablet Take 1 tablet (250 mg total) by mouth daily. 07/14/23  Yes Robinette Vermell JINNY, MD  chlorpheniramine-HYDROcodone  (TUSSIONEX) 10-8 MG/5ML Take 5 mLs by mouth every 12 (twelve) hours as needed for cough. 07/14/23  Yes Robinette Vermell JINNY, MD  predniSONE  (DELTASONE ) 20 MG tablet 3 tablets daily x 4 days 07/14/23  Yes Janiel Derhammer J, MD  albuterol  (VENTOLIN  HFA)  108 (90 Base) MCG/ACT inhaler Inhale 1-2 puffs into the lungs every 6 (six) hours as needed for wheezing or shortness of breath. 03/19/23   Ostwalt, Janna, PA-C  amLODipine  (NORVASC ) 5 MG tablet Take 1 tablet (5 mg total) by mouth daily. 02/13/23   Ostwalt, Janna, PA-C  atorvastatin  (LIPITOR) 10 MG tablet Take 1 tablet (10 mg total) by mouth daily. 09/11/22   Ostwalt, Janna, PA-C  buPROPion (ZYBAN) 150 MG 12 hr tablet Take 1 tablet (150 mg total) by mouth 2 (two) times daily. 05/07/23   Ostwalt, Janna, PA-C  fluticasone -salmeterol (ADVAIR) 100-50 MCG/ACT AEPB Inhale 1 puff into the lungs 2 (two) times daily. 02/13/23   Ostwalt, Janna, PA-C  loratadine  (CLARITIN ) 10 MG tablet Take 1 tablet (10 mg total) by mouth at bedtime as needed for allergies or rhinitis. 01/17/23 01/17/24  Von Bellis, MD  losartan -hydrochlorothiazide  (HYZAAR) 100-25 MG tablet Take 1 tablet by mouth daily. 02/13/23   Ostwalt, Janna, PA-C  metFORMIN  (GLUCOPHAGE ) 500 MG tablet Take 1 tablet (500 mg total) by mouth 2 (two) times daily with a meal. 09/11/22   Ostwalt, Janna, PA-C  omeprazole  (PRILOSEC ) 20 MG capsule TAKE 1 CAPSULE (20 MG TOTAL) BY MOUTH 2 (TWO) TIMES DAILY BEFORE A MEAL. 04/14/23   Ostwalt, Janna, PA-C     Allergies  Patient has no known allergies.   Family History   Family History  Problem Relation Age of Onset   Diabetes Maternal Uncle  Diabetes Paternal Uncle      Physical Exam  Triage Vital Signs: ED Triage Vitals  Encounter Vitals Group     BP 07/14/23 0133 (!) 180/126     Girls Systolic BP Percentile --      Girls Diastolic BP Percentile --      Boys Systolic BP Percentile --      Boys Diastolic BP Percentile --      Pulse Rate 07/14/23 0133 (!) 107     Resp 07/14/23 0133 18     Temp 07/14/23 0133 98.2 F (36.8 C)     Temp Source 07/14/23 0133 Oral     SpO2 07/14/23 0133 95 %     Weight 07/14/23 0131 (!) 311 lb (141.1 kg)     Height 07/14/23 0131 6' 1 (1.854 m)     Head Circumference --       Peak Flow --      Pain Score 07/14/23 0131 0     Pain Loc --      Pain Education --      Exclude from Growth Chart --     Updated Vital Signs: BP (!) 180/126   Pulse (!) 107   Temp 98.2 F (36.8 C) (Oral)   Resp 18   Ht 6' 1 (1.854 m)   Wt (!) 141.1 kg   SpO2 95%   BMI 41.03 kg/m    General: Awake, no distress.  CV:  RRR.  Good peripheral perfusion.  Resp:  Normal effort.  Mildly diminished, otherwise CTAB. Abd:  Nontender.  No distention.  Other:  Nasal congestion noted.  Bilateral calves are supple and nontender.   ED Results / Procedures / Treatments  Labs (all labs ordered are listed, but only abnormal results are displayed) Labs Reviewed  BASIC METABOLIC PANEL WITH GFR - Abnormal; Notable for the following components:      Result Value   Glucose, Bld 143 (*)    Calcium  8.8 (*)    All other components within normal limits  CBC - Abnormal; Notable for the following components:   WBC 14.3 (*)    RDW 16.8 (*)    All other components within normal limits     EKG  ED ECG REPORT I, Patton Rabinovich J, the attending physician, personally viewed and interpreted this ECG.   Date: 07/14/2023  EKG Time: 0131  Rate: 103  Rhythm: sinus tachycardia  Axis: Normal  Intervals:left anterior fascicular block  ST&T Change: Nonspecific    RADIOLOGY I have independently visualized and interpreted patient's imaging study as well as noted the radiology interpretation:  Chest x-ray: No acute cardiopulmonary process  Official radiology report(s): DG Chest 2 View Result Date: 07/14/2023 CLINICAL DATA:  Shortness of breath, worse when lying flat EXAM: CHEST - 2 VIEW COMPARISON:  01/15/2023 FINDINGS: Stable cardiomediastinal silhouette. No focal consolidation, pleural effusion, or pneumothorax. No displaced rib fractures. IMPRESSION: No active cardiopulmonary disease. Electronically Signed   By: Norman Gatlin M.D.   On: 07/14/2023 02:14     PROCEDURES:  Critical Care  performed: No  Procedures   MEDICATIONS ORDERED IN ED: Medications  predniSONE  (DELTASONE ) tablet 60 mg (has no administration in time range)  azithromycin (ZITHROMAX) tablet 500 mg (has no administration in time range)  albuterol  (VENTOLIN  HFA) 108 (90 Base) MCG/ACT inhaler 2 puff (has no administration in time range)  benzonatate  (TESSALON ) capsule 200 mg (has no administration in time range)     IMPRESSION / MDM / ASSESSMENT AND PLAN /  ED COURSE  I reviewed the triage vital signs and the nursing notes.                             48 year old male presenting with cough, congestion and shortness of breath. Differential includes, but is not limited to, viral syndrome, bronchitis including COPD exacerbation, pneumonia, reactive airway disease including asthma, CHF including exacerbation with or without pulmonary/interstitial edema, pneumothorax, ACS, thoracic trauma, and pulmonary embolism.  I personally reviewed patient's records and note a PCP office visit from 03/19/2023 for follow-up of hypertension, asthma, smoking, hypercholesterolemia, GERD, obesity, diabetes.  Patient's presentation is most consistent with acute complicated illness / injury requiring diagnostic workup.  Laboratory results notable for mild leukocytosis with WBC 14, negative chest x-ray.  Elevated blood pressure noted on triage.  Patient reports taking over-the-counter cold/cough medications.  I cautioned him against taking decongestants as these will elevate his blood pressure.  Will start Z-Pak, Prednisone  burst, Albuterol  inhaler, Tussionex as needed.  Tessalon  now in the ED as patient is driving.  Patient will follow-up closely with his PCP.  Strict return precautions given.  Patient verbalizes understanding and agrees with plan of care.    FINAL CLINICAL IMPRESSION(S) / ED DIAGNOSES   Final diagnoses:  Bronchitis  Hypertension, unspecified type     Rx / DC Orders   ED Discharge Orders          Ordered     predniSONE  (DELTASONE ) 20 MG tablet        07/14/23 0254    azithromycin (ZITHROMAX) 250 MG tablet  Daily        07/14/23 0254    chlorpheniramine-HYDROcodone  (TUSSIONEX) 10-8 MG/5ML  Every 12 hours PRN        07/14/23 0254             Note:  This document was prepared using Dragon voice recognition software and may include unintentional dictation errors.   Marsha Hillman J, MD 07/14/23 [phone]

## 2023-08-05 ENCOUNTER — Ambulatory Visit: Admitting: Physician Assistant

## 2023-08-05 ENCOUNTER — Encounter: Payer: Self-pay | Admitting: Physician Assistant

## 2023-08-05 DIAGNOSIS — E7800 Pure hypercholesterolemia, unspecified: Secondary | ICD-10-CM | POA: Diagnosis not present

## 2023-08-05 DIAGNOSIS — E119 Type 2 diabetes mellitus without complications: Secondary | ICD-10-CM

## 2023-08-05 DIAGNOSIS — R0683 Snoring: Secondary | ICD-10-CM

## 2023-08-05 DIAGNOSIS — I10 Essential (primary) hypertension: Secondary | ICD-10-CM

## 2023-08-05 DIAGNOSIS — J45909 Unspecified asthma, uncomplicated: Secondary | ICD-10-CM | POA: Diagnosis not present

## 2023-08-05 DIAGNOSIS — K219 Gastro-esophageal reflux disease without esophagitis: Secondary | ICD-10-CM

## 2023-08-05 DIAGNOSIS — F17210 Nicotine dependence, cigarettes, uncomplicated: Secondary | ICD-10-CM

## 2023-08-05 DIAGNOSIS — Z7984 Long term (current) use of oral hypoglycemic drugs: Secondary | ICD-10-CM

## 2023-08-05 MED ORDER — LANCET DEVICE MISC: 1.0000 | Freq: Three times a day (TID) | 0 refills | Status: AC

## 2023-08-05 MED ORDER — BLOOD GLUCOSE MONITORING SUPPL DEVI: 1.0000 | Freq: Three times a day (TID) | 0 refills | Status: AC

## 2023-08-05 MED ORDER — ALBUTEROL SULFATE HFA 108 (90 BASE) MCG/ACT IN AERS: 1.0000 | INHALATION_SPRAY | Freq: Four times a day (QID) | RESPIRATORY_TRACT | 0 refills | Status: AC | PRN

## 2023-08-05 MED ORDER — AMLODIPINE BESYLATE 5 MG PO TABS: 5.0000 mg | ORAL_TABLET | Freq: Every day | ORAL | 0 refills | Status: DC

## 2023-08-05 MED ORDER — PANTOPRAZOLE SODIUM 40 MG PO TBEC: 40.0000 mg | DELAYED_RELEASE_TABLET | Freq: Every day | ORAL | 3 refills | Status: DC

## 2023-08-05 MED ORDER — FLUTICASONE-SALMETEROL 100-50 MCG/ACT IN AEPB: 1.0000 | INHALATION_SPRAY | Freq: Two times a day (BID) | RESPIRATORY_TRACT | 3 refills | Status: AC

## 2023-08-05 MED ORDER — LANCETS MISC. MISC: 1.0000 | Freq: Three times a day (TID) | 0 refills | Status: AC

## 2023-08-05 MED ORDER — METFORMIN HCL 500 MG PO TABS: 500.0000 mg | ORAL_TABLET | Freq: Two times a day (BID) | ORAL | 3 refills | Status: AC

## 2023-08-05 MED ORDER — BLOOD GLUCOSE TEST VI STRP: 1.0000 | ORAL_STRIP | Freq: Three times a day (TID) | 0 refills | Status: AC

## 2023-08-05 MED ORDER — ATORVASTATIN CALCIUM 10 MG PO TABS: 10.0000 mg | ORAL_TABLET | Freq: Every day | ORAL | 3 refills | Status: AC

## 2023-08-05 MED ORDER — HYDROCHLOROTHIAZIDE 12.5 MG PO CAPS: 12.5000 mg | ORAL_CAPSULE | Freq: Every day | ORAL | 1 refills | Status: DC

## 2023-08-05 MED ORDER — OMEPRAZOLE 20 MG PO CPDR: 20.0000 mg | DELAYED_RELEASE_CAPSULE | Freq: Two times a day (BID) | ORAL | 0 refills | Status: DC

## 2023-08-05 NOTE — Progress Notes (Unsigned)
 Established patient visit  Patient: Curtis [name]   DOB: [birth year]   47 y.o. Male  MRN: [id number] Visit Date: 08/05/2023  Today's healthcare provider: Jolynn Spencer, PA-C   Chief Complaint  Patient presents with   Hospitalization Follow-up    Hosp f/u wants to discuss BP, DM and  COPD breathing ( Sleep Study)    Subjective     HPI     Hospitalization Follow-up    Additional comments: Hosp f/u wants to discuss BP, DM and  COPD breathing ( Sleep Study)       Last edited by Marylen Odella CROME, CMA on 08/05/2023  2:14 PM.       Discussed the use of AI scribe software for clinical note transcription with the patient, who gave verbal consent to proceed.  History of Present Illness Curtis [name] is a 49 year old male with COPD who presents with bronchitis and cough. He is accompanied by his wife.  He was seen in the emergency department on July 14, 2023, for bronchitis and cough. Laboratory tests indicated a mild infection, and the chest x-ray was negative. He experiences difficulty breathing and frequent bronchitis. He has been smoking for approximately fourteen years and has a chronic cough.  He has hypertension and was prescribed amlodipine  5 mg following his emergency department visit, which may be causing leg swelling. He manages diabetes with metformin  1000 mg, with a recent A1c level of 6.9.  He experiences acid reflux and has tried various over-the-counter medications without success. Omeprazole  was previously ineffective. He experiences bloating and discomfort, particularly at night.  He has sleep disturbances, including snoring and episodes of apnea, noted by his wife. No depression or anxiety. No issues with eyes, tingling, or numbness sensations.       02/13/2023    8:11 AM 10/03/2021   10:11 AM 09/05/2021    8:51 AM  Depression screen PHQ 2/9  Decreased Interest 0 0 0  Down, Depressed, Hopeless 0 0 0  PHQ - 2 Score 0 0 0  Altered sleeping 2 0 0  Tired,  decreased energy 2 0 0  Change in appetite 0 0 0  Feeling bad or failure about yourself  0 0 0  Trouble concentrating 0 0 0  Moving slowly or fidgety/restless 0 0 0  Suicidal thoughts 0 0 0  PHQ-9 Score 4 0 0  Difficult doing work/chores Not difficult at all Not difficult at all Not difficult at all      02/13/2023    8:11 AM  GAD 7 : Generalized Anxiety Score  Nervous, Anxious, on Edge 0  Control/stop worrying 0  Worry too much - different things 0  Trouble relaxing 0  Restless 0  Easily annoyed or irritable 0  Afraid - awful might happen 0  Total GAD 7 Score 0    Medications: Outpatient Medications Prior to Visit  Medication Sig   azithromycin  (ZITHROMAX ) 250 MG tablet Take 1 tablet (250 mg total) by mouth daily.   buPROPion (ZYBAN) 150 MG 12 hr tablet Take 1 tablet (150 mg total) by mouth 2 (two) times daily.   chlorpheniramine-HYDROcodone  (TUSSIONEX) 10-8 MG/5ML Take 5 mLs by mouth every 12 (twelve) hours as needed for cough.   loratadine  (CLARITIN ) 10 MG tablet Take 1 tablet (10 mg total) by mouth at bedtime as needed for allergies or rhinitis.   losartan -hydrochlorothiazide  (HYZAAR) 100-25 MG tablet Take 1 tablet by mouth daily.   predniSONE  (DELTASONE ) 20 MG tablet 3 tablets daily x  4 days   [DISCONTINUED] albuterol  (VENTOLIN  HFA) 108 (90 Base) MCG/ACT inhaler Inhale 1-2 puffs into the lungs every 6 (six) hours as needed for wheezing or shortness of breath.   [DISCONTINUED] amLODipine  (NORVASC ) 5 MG tablet Take 1 tablet (5 mg total) by mouth daily.   [DISCONTINUED] atorvastatin  (LIPITOR) 10 MG tablet Take 1 tablet (10 mg total) by mouth daily.   [DISCONTINUED] fluticasone -salmeterol (ADVAIR) 100-50 MCG/ACT AEPB Inhale 1 puff into the lungs 2 (two) times daily.   [DISCONTINUED] metFORMIN  (GLUCOPHAGE ) 500 MG tablet Take 1 tablet (500 mg total) by mouth 2 (two) times daily with a meal.   [DISCONTINUED] omeprazole  (PRILOSEC ) 20 MG capsule TAKE 1 CAPSULE (20 MG TOTAL) BY MOUTH 2  (TWO) TIMES DAILY BEFORE A MEAL.   No facility-administered medications prior to visit.    Review of Systems  All other systems reviewed and are negative.  All negative Except see HPI   {Insert previous labs (optional):23779} {See past labs  Heme  Chem  Endocrine  Serology  Results Review (optional):1}   Objective    BP (!) 141/82 (BP Location: Right Arm, Patient Position: Sitting)   Pulse 86   Resp 16   Ht 6' 1 (1.854 m)   Wt (!) 312 lb (141.5 kg)   SpO2 98%   BMI 41.16 kg/m  {Insert last BP/Wt (optional):23777}{See vitals history (optional):1}   Physical Exam Vitals reviewed.  Constitutional:      General: He is not in acute distress.    Appearance: Normal appearance. He is not diaphoretic.  HENT:     Head: Normocephalic and atraumatic.  Eyes:     General: No scleral icterus.    Conjunctiva/sclera: Conjunctivae normal.  Cardiovascular:     Rate and Rhythm: Normal rate and regular rhythm.     Pulses: Normal pulses.     Heart sounds: Normal heart sounds. No murmur heard. Pulmonary:     Effort: Pulmonary effort is normal. No respiratory distress.     Breath sounds: Normal breath sounds. No wheezing or rhonchi.  Musculoskeletal:     Cervical back: Neck supple.     Right lower leg: No edema.     Left lower leg: No edema.  Lymphadenopathy:     Cervical: No cervical adenopathy.  Skin:    General: Skin is warm and dry.     Findings: No rash.  Neurological:     Mental Status: He is alert and oriented to person, place, and time. Mental status is at baseline.  Psychiatric:        Mood and Affect: Mood normal.        Behavior: Behavior normal.      No results found for any visits on 08/05/23.      Assessment and Plan Assessment & Plan Chronic Obstructive Pulmonary Disease (COPD) COPD exacerbated by smoking, necessitating cessation to prevent complications and reduce cancer risk. - Discussed smoking cessation strategies. - Ordered annual CT scan  starting at age 31.  Obstructive Sleep Apnea (OSA) Snoring and apnea suggest OSA; sleep study planned for confirmation. - Ordered sleep study. - Discussed potential CPAP use if OSA confirmed.  Hypertension Elevated blood pressure on amlodipine  5 mg; considering diuretic due to leg swelling. - Added diuretic to regimen. - Monitor blood pressure closely. - Advised low-salt diet.  Type 2 Diabetes Mellitus A1c 6.9 indicates early-stage diabetes; lifestyle changes and monitoring essential. - Check A1c, lipids, kidney and liver function. - Prescribed glucometer for glucose monitoring. - Advised dietary modifications and  frequent small meals. - Referred to [employer] for resources.  Gastroesophageal Reflux Disease (GERD) Acid reflux and bloating not controlled with OTC meds; stronger medication needed. - Prescribed Protonix  40 mg daily. - Advised dietary modifications to avoid acidic and spicy foods. - Elevate head during sleep and avoid lying down after meals. - Consider referral to gastroenterologist if symptoms persist.  1. Asthma, unspecified asthma severity, unspecified whether complicated, unspecified whether persistent *** - albuterol  (VENTOLIN  HFA) 108 (90 Base) MCG/ACT inhaler; Inhale 1-2 puffs into the lungs every 6 (six) hours as needed for wheezing or shortness of breath.  Dispense: 18 g; Refill: 0 - fluticasone -salmeterol (ADVAIR) 100-50 MCG/ACT AEPB; Inhale 1 puff into the lungs 2 (two) times daily.  Dispense: 1 each; Refill: 3  2. Hypertension, unspecified type *** - amLODipine  (NORVASC ) 5 MG tablet; Take 1 tablet (5 mg total) by mouth daily.  Dispense: 90 tablet; Refill: 0 - Hemoglobin A1c - Lipid panel - CBC with Differential/Platelet - Comprehensive metabolic panel with GFR - TSH  3. Hypercholesteremia *** - atorvastatin  (LIPITOR) 10 MG tablet; Take 1 tablet (10 mg total) by mouth daily.  Dispense: 90 tablet; Refill: 3  4. Diabetes mellitus  without complication (HCC) *** - metFORMIN  (GLUCOPHAGE ) 500 MG tablet; Take 1 tablet (500 mg total) by mouth 2 (two) times daily with a meal.  Dispense: 180 tablet; Refill: 3 - Blood Glucose Monitoring Suppl DEVI; 1 each by Does not apply route in the morning, at noon, and at bedtime. May substitute to any manufacturer covered by patient's insurance.  Dispense: 1 each; Refill: 0 - Glucose Blood (BLOOD GLUCOSE TEST STRIPS) STRP; 1 each by In Vitro route in the morning, at noon, and at bedtime. May substitute to any manufacturer covered by patient's insurance.  Dispense: 100 strip; Refill: 0 - Lancet Device MISC; 1 each by Does not apply route in the morning, at noon, and at bedtime. May substitute to any manufacturer covered by patient's insurance.  Dispense: 1 each; Refill: 0 - Lancets Misc. MISC; 1 each by Does not apply route in the morning, at noon, and at bedtime. May substitute to any manufacturer covered by patient's insurance.  Dispense: 100 each; Refill: 0  5. Gastroesophageal reflux disease without esophagitis *** - pantoprazole  (PROTONIX ) 40 MG tablet; Take 1 tablet (40 mg total) by mouth daily.  Dispense: 30 tablet; Refill: 3  6. Morbid obesity (HCC) (Primary) *** - Ambulatory referral to Sleep Studies - Hemoglobin A1c - Lipid panel - CBC with Differential/Platelet - Comprehensive metabolic panel with GFR - TSH  7. Snoring *** - Ambulatory referral to Sleep Studies   Orders Placed This Encounter  Procedures   Hemoglobin A1c   Lipid panel    Has the patient fasted?:   Yes   CBC with Differential/Platelet   Comprehensive metabolic panel with GFR    Has the patient fasted?:   Yes   TSH   Ambulatory referral to Sleep Studies    Referral Priority:   Routine    Referral Type:   Consultation    Referral Reason:   Specialty Services Required    Number of Visits Requested:   1    Return in about 4 weeks (around 09/02/2023) for chronic disease f/u, BP f/u.   The patient was  advised to call back or seek an in-person evaluation if the symptoms worsen or if the condition fails to improve as anticipated.  I discussed the assessment and treatment plan with the patient. The patient was  provided an opportunity to ask questions and all were answered. The patient agreed with the plan and demonstrated an understanding of the instructions.  I, Philo Kurtz, PA-C have reviewed all documentation for this visit. The documentation on 08/05/2023  for the exam, diagnosis, procedures, and orders are all accurate and complete.  Jolynn Spencer, [facility], MMS [facility] [phone] (phone) [phone] (fax)  [facility]

## 2023-08-07 DIAGNOSIS — E119 Type 2 diabetes mellitus without complications: Secondary | ICD-10-CM | POA: Insufficient documentation

## 2023-08-07 DIAGNOSIS — E7800 Pure hypercholesterolemia, unspecified: Secondary | ICD-10-CM | POA: Insufficient documentation

## 2023-08-07 DIAGNOSIS — F17210 Nicotine dependence, cigarettes, uncomplicated: Secondary | ICD-10-CM | POA: Insufficient documentation

## 2023-08-07 DIAGNOSIS — R0683 Snoring: Secondary | ICD-10-CM | POA: Insufficient documentation

## 2023-08-07 DIAGNOSIS — K219 Gastro-esophageal reflux disease without esophagitis: Secondary | ICD-10-CM | POA: Insufficient documentation

## 2023-08-12 ENCOUNTER — Ambulatory Visit: Admitting: Sleep Medicine

## 2023-08-21 ENCOUNTER — Ambulatory Visit: Admitting: Sleep Medicine

## 2023-08-21 ENCOUNTER — Telehealth: Payer: Self-pay

## 2023-08-21 NOTE — Telephone Encounter (Signed)
Noted, nothing further needed.  

## 2023-08-21 NOTE — Telephone Encounter (Signed)
 Copied from CRM [id number]. Topic: General - Other >> Aug 21, 2023 11:26 AM Russell PARAS wrote: Reason for CRM:   Pt contacted clinic due to missing his appt today at 9:30 AM w/Reddy for New Pt appt. Have been advised not to reschedule missed appts. Scheduled new appt on 08/8 w/Reddy at 10:30 AM.   Wanted to advise clinic of change.

## 2023-08-22 ENCOUNTER — Encounter: Payer: Self-pay | Admitting: Sleep Medicine

## 2023-08-22 ENCOUNTER — Ambulatory Visit (INDEPENDENT_AMBULATORY_CARE_PROVIDER_SITE_OTHER): Admitting: Sleep Medicine

## 2023-08-22 VITALS — BP 140/90 | HR 87 | Temp 98.8°F | Ht 73.0 in | Wt 313.2 lb

## 2023-08-22 DIAGNOSIS — G4733 Obstructive sleep apnea (adult) (pediatric): Secondary | ICD-10-CM | POA: Diagnosis not present

## 2023-08-22 DIAGNOSIS — I10 Essential (primary) hypertension: Secondary | ICD-10-CM

## 2023-08-22 DIAGNOSIS — G4726 Circadian rhythm sleep disorder, shift work type: Secondary | ICD-10-CM

## 2023-08-22 MED ORDER — TRAZODONE HCL 50 MG PO TABS: 50.0000 mg | ORAL_TABLET | Freq: Every day | ORAL | 3 refills | Status: DC

## 2023-08-22 NOTE — Progress Notes (Signed)
 Name:Curtis [name] MRN: [id number] DOB: 1975/11/10   CHIEF COMPLAINT:  EXCESSIVE DAYTIME SLEEPINESS   HISTORY OF PRESENT ILLNESS:  Curtis [name] is a 48 y.o. w/ a h/o HTN, hyperlipidemia, morbid obesity and DMII who present for c/o loud snoring, witnessed apnea and excessive daytime sleepiness which has been present for several years. Reports nocturnal awakenings due to unclear reasons and occasionally has difficulty falling back to sleep. Denies any significant weight changes. Admits to dry mouth. Denies morning headaches, RLS symptoms, dream enactment, cataplexy, hypnagogic or hypnapompic hallucinations. Denies a family history of sleep apnea. Denies drowsy driving. Drinks 1 glass of tea a few times per week, occasional alcohol use, smokes 1/2 ppd tobacco use, denies illicit drug use. 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   Bedtime  pm Sleep onset 10 mins Rise time 6:30-7:30 am   EPWORTH SLEEP SCORE 9    08/22/2023   10:00 AM  Results of the Epworth flowsheet  Sitting and reading 3  Watching TV 3  Sitting, inactive in a public place (e.g. a theatre or a meeting) 0  As a passenger in a car for an hour without a break 1  Lying down to rest in the afternoon when circumstances permit 1  Sitting and talking to someone 0  Sitting quietly after a lunch without alcohol 1  In a car, while stopped for a few minutes in traffic 0  Total score 9    PAST MEDICAL HISTORY :   has a past medical history of Allergies and Hypertension.  has a past surgical history that includes Colonoscopy with propofol  (N/A, 11/08/2021). Prior to Admission medications   Medication Sig Start Date End Date Taking? Authorizing Provider  albuterol  (VENTOLIN  HFA) 108 (90 Base) MCG/ACT inhaler Inhale 1-2 puffs into the lungs every 6 (six) hours as needed for wheezing or shortness of breath. 08/05/23  Yes Ostwalt, Janna, PA-C  amLODipine  (NORVASC ) 5 MG tablet Take  1 tablet (5 mg total) by mouth daily. 08/05/23  Yes Ostwalt, Janna, PA-C  atorvastatin  (LIPITOR) 10 MG tablet Take 1 tablet (10 mg total) by mouth daily. 08/05/23  Yes Ostwalt, Janna, PA-C  azithromycin  (ZITHROMAX ) 250 MG tablet Take 1 tablet (250 mg total) by mouth daily. 07/14/23  Yes Sung, Jade J, MD  Blood Glucose Monitoring Suppl DEVI 1 each by Does not apply route in the morning, at noon, and at bedtime. May substitute to any manufacturer covered by patient's insurance. 08/05/23  Yes Ostwalt, Janna, PA-C  buPROPion (ZYBAN) 150 MG 12 hr tablet Take 1 tablet (150 mg total) by mouth 2 (two) times daily. 05/07/23  Yes Ostwalt, Janna, PA-C  chlorpheniramine-HYDROcodone  (TUSSIONEX) 10-8 MG/5ML Take 5 mLs by mouth every 12 (twelve) hours as needed for cough. 07/14/23  Yes Robinette Vermell JINNY, MD  fluticasone -salmeterol (ADVAIR) 100-50 MCG/ACT AEPB Inhale 1 puff into the lungs 2 (two) times daily. 08/05/23  Yes Ostwalt, Janna, PA-C  Glucose Blood (BLOOD GLUCOSE TEST STRIPS) STRP 1 each by In Vitro route in the morning, at noon, and at bedtime. May substitute to any manufacturer covered by patient's insurance. 08/05/23 09/04/23 Yes Ostwalt, Janna, PA-C  hydrochlorothiazide  (MICROZIDE ) 12.5 MG capsule Take 1 capsule (12.5 mg total) by mouth daily. 08/05/23  Yes Ostwalt, Janna, PA-C  ibuprofen  (ADVIL ) 800 MG tablet Take 800 mg by mouth 3 (three) times daily as needed. 05/20/23  Yes [provider]  Lancet Device MISC 1 each by  Does not apply route in the morning, at noon, and at bedtime. May substitute to any manufacturer covered by patient's insurance. 08/05/23 09/04/23 Yes Ostwalt, Janna, PA-C  Lancets Misc. MISC 1 each by Does not apply route in the morning, at noon, and at bedtime. May substitute to any manufacturer covered by patient's insurance. 08/05/23 09/04/23 Yes Ostwalt, Janna, PA-C  loratadine  (CLARITIN ) 10 MG tablet Take 1 tablet (10 mg total) by mouth at bedtime as needed for allergies or rhinitis. 01/17/23  01/17/24 Yes Von Bellis, MD  losartan -hydrochlorothiazide  (HYZAAR) 100-25 MG tablet Take 1 tablet by mouth daily. 02/13/23  Yes Ostwalt, Janna, PA-C  metFORMIN  (GLUCOPHAGE ) 500 MG tablet Take 1 tablet (500 mg total) by mouth 2 (two) times daily with a meal. 08/05/23  Yes Ostwalt, Janna, PA-C  omeprazole  (PRILOSEC ) 20 MG capsule Take 20 mg by mouth 2 (two) times daily. 08/05/23  Yes [provider]  pantoprazole  (PROTONIX ) 40 MG tablet Take 1 tablet (40 mg total) by mouth daily. 08/05/23  Yes Ostwalt, Janna, PA-C  predniSONE  (DELTASONE ) 20 MG tablet 3 tablets daily x 4 days 07/14/23  Yes Robinette Vermell PARAS, MD  varenicline  (CHANTIX ) 1 MG tablet Take 1 mg by mouth 2 (two) times daily. 02/13/23  Yes [provider]   No Known Allergies  FAMILY HISTORY:  family history includes Diabetes in his maternal uncle and paternal uncle. SOCIAL HISTORY:  reports that he has been smoking cigarettes. He has never used smokeless tobacco. He reports current alcohol use of about 10.0 standard drinks of alcohol per week. He reports that he does not use drugs.   Review of Systems:  Gen:  Denies  fever, sweats, chills weight loss  HEENT: Denies blurred vision, double vision, ear pain, eye pain, hearing loss, nose bleeds, sore throat Cardiac:  No dizziness, chest pain or heaviness, chest tightness,edema, No JVD Resp:   No cough, -sputum production, -shortness of breath,-wheezing, -hemoptysis,  Gi: Denies swallowing difficulty, stomach pain, nausea or vomiting, diarrhea, constipation, bowel incontinence Gu:  Denies bladder incontinence, burning urine Ext:   Denies Joint pain, stiffness or swelling Skin: Denies  skin rash, easy bruising or bleeding or hives Endoc:  Denies polyuria, polydipsia , polyphagia or weight change Psych:   Denies depression, insomnia or hallucinations  Other:  All other systems negative  VITAL SIGNS: BP (!) 140/90 (BP Location: Right Arm, Patient Position: Sitting, Cuff Size:  Large) Comment: hasn't taken BP meds or eaten yet  Pulse 87   Temp 98.8 F (37.1 C) (Oral)   Ht 6' 1 (1.854 m)   Wt (!) 313 lb 3.2 oz (142.1 kg)   SpO2 95%   BMI 41.32 kg/m    Physical Examination:   General Appearance: No distress  EYES PERRLA, EOM intact.   NECK Supple, No JVD Pulmonary: normal breath sounds, No wheezing.  CardiovascularNormal S1,S2.  No m/r/g.   Abdomen: Benign, Soft, non-tender. Skin:   warm, no rashes, no ecchymosis  Extremities: normal, no cyanosis, clubbing. Neuro:without focal findings,  speech normal  PSYCHIATRIC: Mood, affect within normal limits.   ASSESSMENT AND PLAN  OSA I suspect that OSA is likely present due to clinical presentation. Discussed the consequences of untreated sleep apnea. Advised not to drive drowsy for safety of patient and others. Will complete further evaluation with a home sleep study and follow up to review results.    HTN BP elevated, advised patient to follow up with PCP for further management.  Shift work sleep disorder Counseled patient on  stimulus control and improving sleep hygiene practices. Also counseled patient on keeping a regular sleep schedule on all days. Will also try patient on Trazodone  50 mg, advised patient to take only if he is able to sleep 8 hours per night. Due to prolonged sleep deprivation and safety concerns, provided patient with a work excuse to take off today and return to work tomorrow. Advised not to drive drowsy for safety of patient and others.   Morbid obesity Counseled patient on diet and lifestyle modification.    MEDICATION ADJUSTMENTS/LABS AND TESTS ORDERED: Recommend Sleep Study   Patient  satisfied with Plan of action and management. All questions answered  Follow up to review HST results and treatment plan.   I spent a total of 52 minutes reviewing chart data, face-to-face evaluation with the patient, counseling and coordination of care as detailed above.    Marlena Barbato,  M.D.  Sleep Medicine [facility]

## 2023-08-22 NOTE — Patient Instructions (Signed)
 SABRA

## 2023-08-27 ENCOUNTER — Other Ambulatory Visit: Payer: Self-pay | Admitting: Physician Assistant

## 2023-08-27 DIAGNOSIS — I10 Essential (primary) hypertension: Secondary | ICD-10-CM

## 2023-09-02 ENCOUNTER — Emergency Department: Payer: Worker's Compensation

## 2023-09-02 ENCOUNTER — Other Ambulatory Visit: Payer: Self-pay

## 2023-09-02 ENCOUNTER — Emergency Department
Admission: EM | Admit: 2023-09-02 | Discharge: 2023-09-03 | Disposition: A | Discharge status: Home / Self Care | Payer: Worker's Compensation | Attending: Emergency Medicine | Admitting: Emergency Medicine

## 2023-09-02 DIAGNOSIS — Y990 Civilian activity done for income or pay: Secondary | ICD-10-CM | POA: Insufficient documentation

## 2023-09-02 DIAGNOSIS — S99922A Unspecified injury of left foot, initial encounter: Secondary | ICD-10-CM | POA: Diagnosis present

## 2023-09-02 DIAGNOSIS — I10 Essential (primary) hypertension: Secondary | ICD-10-CM | POA: Diagnosis not present

## 2023-09-02 DIAGNOSIS — J45909 Unspecified asthma, uncomplicated: Secondary | ICD-10-CM | POA: Diagnosis not present

## 2023-09-02 DIAGNOSIS — S8265XA Nondisplaced fracture of lateral malleolus of left fibula, initial encounter for closed fracture: Secondary | ICD-10-CM | POA: Diagnosis not present

## 2023-09-02 DIAGNOSIS — W230XXA Caught, crushed, jammed, or pinched between moving objects, initial encounter: Secondary | ICD-10-CM | POA: Diagnosis not present

## 2023-09-02 DIAGNOSIS — S92025A Nondisplaced fracture of anterior process of left calcaneus, initial encounter for closed fracture: Secondary | ICD-10-CM | POA: Insufficient documentation

## 2023-09-02 DIAGNOSIS — E119 Type 2 diabetes mellitus without complications: Secondary | ICD-10-CM | POA: Diagnosis not present

## 2023-09-02 MED ORDER — ONDANSETRON HCL 4 MG/2ML IJ SOLN
4.0000 mg | Freq: Once | INTRAMUSCULAR | Status: AC
  Administered 2023-09-02: 4 mg via INTRAVENOUS

## 2023-09-02 MED ORDER — OXYCODONE HCL 5 MG PO TABS: 5.0000 mg | ORAL_TABLET | Freq: Four times a day (QID) | ORAL | 0 refills | Status: DC | PRN

## 2023-09-02 MED ORDER — HYDROMORPHONE HCL 1 MG/ML IJ SOLN
0.5000 mg | Freq: Once | INTRAMUSCULAR | Status: AC
  Administered 2023-09-02: 0.5 mg via INTRAVENOUS

## 2023-09-02 NOTE — ED Triage Notes (Signed)
 Pt reports his left foot got caught between a forklift and the wall. Pt c/o pain to left ankle and foot. Pt is workers comp

## 2023-09-02 NOTE — ED Notes (Addendum)
 Significant swelling to L ankle. Pulses, capillary refill, and sensation intact at this time. Pt able to wiggle toes with significant pain. Legs elevated in bed and ice packs applied laterally and medially.

## 2023-09-02 NOTE — ED Provider Notes (Incomplete)
   [facility] Provider Note    None    (approximate)   History   Foot Injury   HPI  ABEER IVERSEN is a 48 y.o. male with history of hypertension, asthma, diabetes and as listed in EMR presents to the emergency department for treatment and evaluation of the left foot pain.  While at work, his left foot got caught between the forklift and the wall.  He is having pain in the ankle and top of the foot. No additional injury.     Physical Exam    Vitals:   09/02/23 2229 09/02/23 2251  BP:  (!) 181/107  Pulse: 91 91  Resp:  19  Temp:    SpO2: 100% 99%    General: Awake, no distress.  CV:  Good peripheral perfusion.  Resp:  Normal effort.  Abd:  No distention.  Other:  Left ankle and foot edematous with early ecchymosis to the area over the lateral malleolus.  Skin is warm and dry.  No open wounds.  Patient able to demonstrate motion of toes.  Unable to flex or extend left foot secondary to pain.   ED Results / Procedures / Treatments   Labs (all labs ordered are listed, but only abnormal results are displayed)  Labs Reviewed - No data to display   EKG  Not indicated.   RADIOLOGY  Image and radiology report reviewed and interpreted by me. Radiology report consistent with the same.  X-ray image of the left ankle shows questionable nondisplaced lateral malleolus fracture.  Image of the left foot is negative for acute concerns.  PROCEDURES:  Critical Care performed: No  Procedures   MEDICATIONS ORDERED IN ED:  Medications  HYDROmorphone  (DILAUDID ) injection 0.5 mg (0.5 mg Intravenous Given 09/02/23 2238)  ondansetron  (ZOFRAN ) injection 4 mg (4 mg Intravenous Given 09/02/23 2237)     IMPRESSION / MDM / ASSESSMENT AND PLAN / ED COURSE   I have reviewed the triage note and vital signs. Vital signs stable   Differential diagnosis includes, but is not limited to, crush injury, distal tibia/fibular fracture, foot  fracture  Patient's presentation is most consistent with acute illness / injury with system symptoms.  48 year old male presenting to the emergency department for treatment and evaluation after his left foot caught crushed between the forklift and wall while at work.  See HPI for further details.  Patient is in significant pain.  IV  Dilaudid  and Zofran  ordered.  X-ray is inconclusive for fracture.  Due to patient's level of pain and possible lateral malleolus fracture, CT imaging ordered.  CT shows acute mildly comminuted nondisplaced fracture involving the lateral fibular malleolus below the level of the mortise and an acute nondisplaced fracture involving the sustentaculum tali of the calcaneus.  Considerable soft tissue edema of the lower leg, ankle, and foot is noted as well.        FINAL CLINICAL IMPRESSION(S) / ED DIAGNOSES   Final diagnoses:  None     Rx / DC Orders   ED Discharge Orders     None        Note:  This document was prepared using Dragon voice recognition software and may include unintentional dictation errors.

## 2023-09-02 NOTE — Discharge Instructions (Signed)
 Avoid weightbearing.  Call tomorrow to schedule an appointment with the foot and ankle specialist.  If your pain is not controllable with medication or if you notice significant increase in swelling or the cast feels too tight, see the foot and ankle specialist right away or come back to the emergency department.

## 2023-09-02 NOTE — ED Provider Notes (Signed)
 [facility] Provider Note    None    (approximate)   History   Foot Injury   HPI  Curtis [name] is a 48 y.o. male with history of hypertension, asthma, diabetes and as listed in EMR presents to the emergency department for treatment and evaluation of the left foot pain.  While at work, his left foot got caught between the forklift and the wall.  He is having pain in the ankle and top of the foot. No additional injury.     Physical Exam    Vitals:   09/02/23 2251 09/02/23 2300  BP: (!) 181/107 (!) 178/106  Pulse: 91 90  Resp: 19   Temp:    SpO2: 99% 100%    General: Awake, no distress.  CV:  Good peripheral perfusion.  Resp:  Normal effort.  Abd:  No distention.  Other:  Left ankle and foot edematous with early ecchymosis to the area over the lateral malleolus.  Skin is warm and dry.  No open wounds.  Patient able to demonstrate motion of toes.  Unable to flex or extend left foot secondary to pain.   ED Results / Procedures / Treatments   Labs (all labs ordered are listed, but only abnormal results are displayed)  Labs Reviewed - No data to display   EKG  Not indicated.   RADIOLOGY  Image and radiology report reviewed and interpreted by me. Radiology report consistent with the same.  X-ray image of the left ankle shows questionable nondisplaced lateral malleolus fracture.  Image of the left foot is negative for acute concerns.  PROCEDURES:  Critical Care performed: No  .Ortho Injury Treatment  Date/Time: 09/03/2023 12:04 AM  Performed by: Herlinda Kirk NOVAK, FNP Authorized by: Herlinda Kirk NOVAK, FNP   Consent:    Consent obtained:  Verbal   Consent given by:  PatientInjury location: lower leg Location details: left lower leg Injury type: fracture Fracture type: lateral malleolus Pre-procedure neurovascular assessment: neurovascularly intact Pre-procedure distal perfusion: normal Pre-procedure neurological function:  normal Pre-procedure range of motion: reduced Manipulation performed: no Immobilization: splint Splint type: ankle stirrup and short leg Splint Applied by: ED Tech Supplies used: cotton padding, elastic bandage and Ortho-Glass Post-procedure neurovascular assessment: post-procedure neurovascularly intact Post-procedure distal perfusion: normal Post-procedure neurological function: normal Post-procedure range of motion: unchanged      MEDICATIONS ORDERED IN ED:  Medications  HYDROmorphone  (DILAUDID ) injection 0.5 mg (0.5 mg Intravenous Given 09/02/23 2238)  ondansetron  (ZOFRAN ) injection 4 mg (4 mg Intravenous Given 09/02/23 2237)  HYDROmorphone  (DILAUDID ) injection 0.5 mg (0.5 mg Intravenous Given 09/02/23 2321)     IMPRESSION / MDM / ASSESSMENT AND PLAN / ED COURSE   I have reviewed the triage note and vital signs. Vital signs stable   Differential diagnosis includes, but is not limited to, crush injury, distal tibia/fibular fracture, foot fracture  Patient's presentation is most consistent with acute illness / injury with system symptoms.  48 year old male presenting to the emergency department for treatment and evaluation after his left foot caught crushed between the forklift and wall while at work.  See HPI for further details.  Patient is in significant pain.  IV  Dilaudid  and Zofran  ordered.  X-ray is inconclusive for fracture.  Due to patient's level of pain and possible lateral malleolus fracture, CT imaging ordered.  CT shows acute mildly comminuted nondisplaced fracture involving the lateral fibular malleolus below the level of the mortise and an acute nondisplaced fracture involving the sustentaculum  tali of the calcaneus.  Considerable soft tissue edema of the lower leg, ankle, and foot is noted as well.  OCL applied as described above.  Patient is neurovascularly intact.  Second dose of Dilaudid  provided and pain is better controlled.  Patient will be discharged  home and is to avoid weightbearing.  Walker provided.  Outpatient follow-up with podiatry advised and contact information was provided.  Oxycodone  submitted to the patient's pharmacy and a work note was included in his discharge instructions.  He was encouraged to return to the emergency department if the pain is not controllable with medication or for other concerns if unable to see podiatry.      FINAL CLINICAL IMPRESSION(S) / ED DIAGNOSES   Final diagnoses:  Closed nondisplaced fracture of lateral malleolus of left fibula, initial encounter  Closed nondisplaced fracture of anterior process of left calcaneus, initial encounter     Rx / DC Orders   ED Discharge Orders          Ordered    oxyCODONE  (ROXICODONE ) 5 MG immediate release tablet  Every 6 hours PRN        09/02/23 2355             Note:  This document was prepared using Dragon voice recognition software and may include unintentional dictation errors.   Herlinda Kirk NOVAK, FNP 09/03/23 0008    Dorothyann Drivers, MD 09/05/23 1501

## 2023-09-02 NOTE — ED Notes (Signed)
 Patient transported to CT

## 2023-09-02 NOTE — ED Notes (Signed)
Pain meds given

## 2023-09-03 NOTE — ED Notes (Signed)
 Pt arrived to the ed with a worker comp injury. Pt does not have a supervisor present during his visit nor does he have any paperwork from his job stating what kind oral swab is requested. Due to the information above an oral swab screening was not completed.

## 2023-09-04 ENCOUNTER — Ambulatory Visit: Admitting: Physician Assistant

## 2023-09-04 ENCOUNTER — Encounter

## 2023-09-04 DIAGNOSIS — G4733 Obstructive sleep apnea (adult) (pediatric): Secondary | ICD-10-CM

## 2023-09-05 ENCOUNTER — Encounter: Payer: Self-pay | Admitting: Podiatry

## 2023-09-05 ENCOUNTER — Ambulatory Visit (INDEPENDENT_AMBULATORY_CARE_PROVIDER_SITE_OTHER): Payer: Worker's Compensation | Admitting: Podiatry

## 2023-09-05 VITALS — Ht 73.0 in | Wt 311.0 lb

## 2023-09-05 DIAGNOSIS — S82832A Other fracture of upper and lower end of left fibula, initial encounter for closed fracture: Secondary | ICD-10-CM | POA: Diagnosis not present

## 2023-09-05 MED ORDER — IBUPROFEN 800 MG PO TABS: 800.0000 mg | ORAL_TABLET | Freq: Three times a day (TID) | ORAL | 1 refills | Status: DC

## 2023-09-05 MED ORDER — OXYCODONE-ACETAMINOPHEN 5-325 MG PO TABS: 1.0000 | ORAL_TABLET | ORAL | 0 refills | Status: DC | PRN

## 2023-09-05 NOTE — Progress Notes (Signed)
   Chief Complaint  Patient presents with   Fracture    Pt is here due to non displaced fracture to the left ankle states he got hurt at work crushed foot in between wall and forklift, was seen at ED for this issue had a CT and x-ray done, states he is in a lot of pain.    HPI: 48 y.o. male presenting today as a new patient for work-related injury.  DOI: 09/02/2023.  Patient states that he caught his left foot and ankle between a wall and a forklift and was crushed.  He went to the emergency department diagnosed with comminuted nondisplaced fracture of the lateral fibular malleolus.  Currently nonweightbearing in posterior splint.  Presenting for outpatient follow-up  Past Medical History:  Diagnosis Date   Allergies    Hypertension     Past Surgical History:  Procedure Laterality Date   COLONOSCOPY WITH PROPOFOL  N/A 11/08/2021   Procedure: COLONOSCOPY WITH PROPOFOL ;  Surgeon: Therisa Bi, MD;  Location: [facility] ENDOSCOPY;  Service: Gastroenterology;  Laterality: N/A;    No Known Allergies   Physical Exam: General: The patient is alert and oriented x3 in no acute distress.  Dermatology: Skin is warm, dry and supple bilateral lower extremities.  No open lacerations  Vascular: Palpable pedal pulses bilaterally. Capillary refill within normal limits.  Edema with ecchymosis noted.  Neurological: Grossly intact via light touch  Musculoskeletal Exam: Gross alignment of the foot and ankle.    DG Foot Complete Left 09/02/2023 IMPRESSION: Questionable nondisplaced lateral malleolar fracture. This could be further evaluated with CT if felt clinically indicated. Diffuse soft tissue swelling about the ankle.  DG Ankle Complete Left 09/02/2023 IMPRESSION: Questionable nondisplaced lateral malleolar fracture. This could be further evaluated with CT if felt clinically indicated. Diffuse soft tissue swelling about the ankle.  CT Ankle Left Wo Contrast 09/02/2023 IMPRESSION: 1. Acute mildly  comminuted nondisplaced fracture involving the lateral fibular malleolus below the level of the mortise. 2. Acute nondisplaced fracture involving the sustentaculum tali of the calcaneus. 3. Considerable soft tissue edema at the lower leg, ankle and foot.   Assessment/Plan of Care: 1.  Nondisplaced comminuted ankle fracture fibular malleolus left  -Patient evaluated.  X-rays reviewed -Cam boot dispensed.  Continue NWB using crutches -Order placed for knee scooter and shower chair -Prescription for Percocet 5/325 mg every 4 hours as needed pain -Refrain from work.  Patient unable to perform job duties with broken ankle and nonweightbearing in a cam boot. -Return to clinic 6 weeks follow-up x-ray       Thresa EMERSON Sar, DPM [facility]  Dr. Thresa EMERSON Sar, DPM    [address] [place], [place] [address]                Office [phone]  Fax [phone]

## 2023-09-09 ENCOUNTER — Telehealth: Payer: Self-pay | Admitting: Podiatry

## 2023-09-09 NOTE — Telephone Encounter (Signed)
 Per Dr. Janit, it's ok to drain the blisters and apply Neosporin with a bandage. These are likely fracture blisters and not worrisome. The blisters were present at his office visit on Friday, and treatment was also discussed at that time.Attempted to call patient back to discuss further, but the number cannot be completed as dialed. - Patient's care giver called back and stated the patient was in pain and that the blisters were getting bigger, larger than a quarter. Offered after hours appointment and 9:15 tomorrow morning in [place], but he declined, stating he didn't have a ride. Patient then said never mind, he would just pop them and use neosporin. The call then disconnected.

## 2023-09-09 NOTE — Telephone Encounter (Signed)
 Wife's number is [phone]

## 2023-09-09 NOTE — Telephone Encounter (Signed)
 Patient saw you for [facility] on Friday. He had some blisters on his foot. Blisters are getting bigger and he is very concerned.

## 2023-09-09 NOTE — Telephone Encounter (Signed)
 Spoke with patient is aware and has done so no other concerns at this time.

## 2023-09-13 ENCOUNTER — Other Ambulatory Visit: Payer: Self-pay | Admitting: Sleep Medicine

## 2023-09-16 ENCOUNTER — Telehealth: Payer: Self-pay | Admitting: Lab

## 2023-09-16 ENCOUNTER — Other Ambulatory Visit: Payer: Self-pay | Admitting: Podiatry

## 2023-09-16 MED ORDER — OXYCODONE-ACETAMINOPHEN 5-325 MG PO TABS: 1.0000 | ORAL_TABLET | ORAL | 0 refills | Status: DC | PRN

## 2023-09-16 NOTE — Telephone Encounter (Signed)
 Patient is calling in for pain medication refill.

## 2023-09-16 NOTE — Telephone Encounter (Signed)
 Refilled per Dr. Jess.

## 2023-09-16 NOTE — Progress Notes (Signed)
 PRN pain  Curtis [name], DPM [facility]  Dr. Thresa EMERSON [name], DPM    [address] [place], [place] [address]                Office [phone]  Fax [phone]

## 2023-09-23 ENCOUNTER — Ambulatory Visit (INDEPENDENT_AMBULATORY_CARE_PROVIDER_SITE_OTHER): Admitting: Podiatry

## 2023-09-23 ENCOUNTER — Ambulatory Visit (INDEPENDENT_AMBULATORY_CARE_PROVIDER_SITE_OTHER)

## 2023-09-23 ENCOUNTER — Encounter: Payer: Self-pay | Admitting: Podiatry

## 2023-09-23 VITALS — Ht 73.0 in | Wt 311.0 lb

## 2023-09-23 DIAGNOSIS — S82832A Other fracture of upper and lower end of left fibula, initial encounter for closed fracture: Secondary | ICD-10-CM

## 2023-09-23 DIAGNOSIS — R600 Localized edema: Secondary | ICD-10-CM | POA: Diagnosis not present

## 2023-09-23 MED ORDER — OXYCODONE-ACETAMINOPHEN 10-325 MG PO TABS: 1.0000 | ORAL_TABLET | ORAL | 0 refills | Status: DC | PRN

## 2023-09-23 MED ORDER — SILVER SULFADIAZINE 1 % EX CREA: 1.0000 | TOPICAL_CREAM | Freq: Every day | CUTANEOUS | 1 refills | Status: DC

## 2023-09-23 NOTE — Progress Notes (Signed)
 No chief complaint on file.   HPI: 48 y.o. male presenting today for follow-up evaluation regarding work-related injury.  Ankle fracture left.  He is currently nonweightbearing using the knee scooter.  He has severe pain and tenderness associated to the left leg and lower extremity in general.  Brief history: DOI: 09/02/2023.  Patient states that he caught his left foot and ankle between a wall and a forklift and was crushed.  He went to the emergency department diagnosed with comminuted nondisplaced fracture of the lateral fibular malleolus.    Past Medical History:  Diagnosis Date   Allergies    Hypertension     Past Surgical History:  Procedure Laterality Date   COLONOSCOPY WITH PROPOFOL  N/A 11/08/2021   Procedure: COLONOSCOPY WITH PROPOFOL ;  Surgeon: Therisa Bi, MD;  Location: [facility] ENDOSCOPY;  Service: Gastroenterology;  Laterality: N/A;    No Known Allergies   Physical Exam: General: The patient is alert and oriented x3 in no acute distress.  Dermatology: Superficial wounds noted from the previously mentioned fracture blisters.  They appear very superficial and stable. Discoloration of the lower extremity also noted concerning for possible vascular compromise  Vascular: Pulses are palpable but faint.  Heavy edema with tightness noted throughout the lower extremity.  Significant calf pain as well left lower extremity.  Neurological: Grossly intact via light touch  Musculoskeletal Exam: Gross alignment of the foot and ankle.    Radiographic exam LT ankle 09/23/2023: Unchanged.  Stable.  No malalignment  DG Foot Complete Left 09/02/2023 IMPRESSION: Questionable nondisplaced lateral malleolar fracture. This could be further evaluated with CT if felt clinically indicated. Diffuse soft tissue swelling about the ankle.  DG Ankle Complete Left 09/02/2023 IMPRESSION: Questionable nondisplaced lateral malleolar fracture. This could be further evaluated with CT if felt clinically  indicated. Diffuse soft tissue swelling about the ankle.  CT Ankle Left Wo Contrast 09/02/2023 IMPRESSION: 1. Acute mildly comminuted nondisplaced fracture involving the lateral fibular malleolus below the level of the mortise. 2. Acute nondisplaced fracture involving the sustentaculum tali of the calcaneus. 3. Considerable soft tissue edema at the lower leg, ankle and foot.   Assessment/Plan of Care: 1.  Nondisplaced comminuted ankle fracture fibular malleolus left 2.  Concern for vascular ischemia/PVD bilateral lower extremities 3.  Concern for possible DVT LLE   -Patient evaluated.  X-rays reviewed.  Stable - Due to patient's past smoking history and faint palpable pulses with pain out of proportion to the injury, I did go ahead and order arterial duplex bilateral lower extremities to rule out any PVD. Stat order. -Also due to tightness of the calf with pain I did also order stat venous Doppler to rule out DVT -Prescription for Silvadene  cream.  Apply to the superficial fracture blister wounds with nonadherent gauze and Ace wrap.  Supplies provided -Continue strict NWB LLE using a knee scooter -Will plan to contact the patient after results of the vascular studies -Return to clinic 4 weeks follow-up x-ray      Thresa EMERSON Sar, DPM [facility]  Dr. Thresa EMERSON Sar, DPM    [address] [place], [place] [address]                Office [phone]  Fax [phone]

## 2023-09-24 DIAGNOSIS — R069 Unspecified abnormalities of breathing: Secondary | ICD-10-CM | POA: Diagnosis not present

## 2023-09-25 ENCOUNTER — Ambulatory Visit: Payer: Self-pay

## 2023-09-25 DIAGNOSIS — G4733 Obstructive sleep apnea (adult) (pediatric): Secondary | ICD-10-CM

## 2023-09-26 ENCOUNTER — Ambulatory Visit: Admitting: Podiatry

## 2023-09-26 ENCOUNTER — Telehealth: Payer: Self-pay | Admitting: Sleep Medicine

## 2023-09-26 NOTE — Telephone Encounter (Signed)
 Copied from CRM [id number]. Topic: Clinical - Order For Equipment >> Sep 26, 2023 10:56 AM Rozanna MATSU wrote: Reason for CRM: Jada stated her system freezing up and if we can fax the sleep study for the patient CPAP machine for the referral [id number]

## 2023-09-27 ENCOUNTER — Other Ambulatory Visit: Payer: Self-pay | Admitting: Physician Assistant

## 2023-09-27 DIAGNOSIS — I10 Essential (primary) hypertension: Secondary | ICD-10-CM

## 2023-09-30 ENCOUNTER — Ambulatory Visit (INDEPENDENT_AMBULATORY_CARE_PROVIDER_SITE_OTHER)

## 2023-09-30 ENCOUNTER — Other Ambulatory Visit: Payer: Self-pay | Admitting: Podiatry

## 2023-09-30 ENCOUNTER — Ambulatory Visit: Admitting: Podiatry

## 2023-09-30 DIAGNOSIS — S82832A Other fracture of upper and lower end of left fibula, initial encounter for closed fracture: Secondary | ICD-10-CM

## 2023-09-30 DIAGNOSIS — R0989 Other specified symptoms and signs involving the circulatory and respiratory systems: Secondary | ICD-10-CM

## 2023-09-30 DIAGNOSIS — R600 Localized edema: Secondary | ICD-10-CM

## 2023-10-01 ENCOUNTER — Ambulatory Visit: Admitting: Physician Assistant

## 2023-10-01 ENCOUNTER — Encounter: Payer: Self-pay | Admitting: Physician Assistant

## 2023-10-01 VITALS — BP 170/124 | HR 94 | Resp 20 | Ht 73.0 in | Wt 311.0 lb

## 2023-10-01 DIAGNOSIS — I152 Hypertension secondary to endocrine disorders: Secondary | ICD-10-CM

## 2023-10-01 DIAGNOSIS — F17210 Nicotine dependence, cigarettes, uncomplicated: Secondary | ICD-10-CM

## 2023-10-01 DIAGNOSIS — E119 Type 2 diabetes mellitus without complications: Secondary | ICD-10-CM

## 2023-10-01 DIAGNOSIS — E785 Hyperlipidemia, unspecified: Secondary | ICD-10-CM

## 2023-10-01 DIAGNOSIS — M79672 Pain in left foot: Secondary | ICD-10-CM

## 2023-10-01 DIAGNOSIS — K219 Gastro-esophageal reflux disease without esophagitis: Secondary | ICD-10-CM | POA: Diagnosis not present

## 2023-10-01 DIAGNOSIS — E1159 Type 2 diabetes mellitus with other circulatory complications: Secondary | ICD-10-CM | POA: Diagnosis not present

## 2023-10-01 DIAGNOSIS — Z7984 Long term (current) use of oral hypoglycemic drugs: Secondary | ICD-10-CM

## 2023-10-01 DIAGNOSIS — E1169 Type 2 diabetes mellitus with other specified complication: Secondary | ICD-10-CM

## 2023-10-01 MED ORDER — CELECOXIB 200 MG PO CAPS: 200.0000 mg | ORAL_CAPSULE | Freq: Two times a day (BID) | ORAL | 1 refills | Status: DC

## 2023-10-01 MED ORDER — PANTOPRAZOLE SODIUM 40 MG PO TBEC: 40.0000 mg | DELAYED_RELEASE_TABLET | Freq: Every day | ORAL | 2 refills | Status: AC

## 2023-10-01 NOTE — Telephone Encounter (Signed)
 Has been faxed.NFN

## 2023-10-01 NOTE — Progress Notes (Signed)
 Established patient visit  Patient: Curtis [name]   DOB: May 13, 1975   48 y.o. Male  MRN: [id number] Visit Date: 10/01/2023  Today's healthcare provider: Jolynn Spencer, PA-C   Chief Complaint  Patient presents with   Medical Management of Chronic Issues    Hypertension- Not checking at home Diabetes- not checking at home. No eye exam scheduled Anxiety- has been sitting at home just driving home [place]- out of medicaton   Subjective     Discussed the use of AI scribe software for clinical note transcription with the patient, who gave verbal consent to proceed.  History of Present Illness Curtis [name] is a 48 year old male with hypertension and diabetes who presents with foot pain after a fracture.  He experiences significant foot pain following a crush injury, which severely impacts his daily activities and ability to work. He uses ibuprofen  and Oxycontin  for pain management but prefers ibuprofen  and Tylenol  due to concerns about narcotic use. He expresses frustration with the lack of improvement in his condition.  He has hypertension, managed with amlodipine , atorvastatin , hydrochlorothiazide , and losartan , but reports elevated blood pressure. He also has diabetes, managed with metformin .  He smokes as a coping mechanism and finds it difficult to quit. 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 He has not sought counseling.  He experiences persistent acid reflux, managed with pantoprazole , especially after consuming certain foods. No chest pain, shortness of breath, or palpitations. No weakness prior to the foot injury.       10/01/2023    2:41 PM 02/13/2023    8:11 AM 10/03/2021   10:11 AM  Depression screen PHQ 2/9  Decreased Interest 0 0 0  Down, Depressed, Hopeless 0 0 0  PHQ - 2 Score 0 0 0  Altered sleeping  2 0  Tired, decreased energy  2 0  Change in appetite  0 0  Feeling bad or failure about yourself   0 0  Trouble  concentrating  0 0  Moving slowly or fidgety/restless  0 0  Suicidal thoughts  0 0  PHQ-9 Score  4 0  Difficult doing work/chores  Not difficult at all Not difficult at all      02/13/2023    8:11 AM  GAD 7 : Generalized Anxiety Score  Nervous, Anxious, on Edge 0  Control/stop worrying 0  Worry too much - different things 0  Trouble relaxing 0  Restless 0  Easily annoyed or irritable 0  Afraid - awful might happen 0  Total GAD 7 Score 0    Medications: Outpatient Medications Prior to Visit  Medication Sig   albuterol  (VENTOLIN  HFA) 108 (90 Base) MCG/ACT inhaler Inhale 1-2 puffs into the lungs every 6 (six) hours as needed for wheezing or shortness of breath.   amLODipine  (NORVASC ) 5 MG tablet Take 1 tablet (5 mg total) by mouth daily.   atorvastatin  (LIPITOR) 10 MG tablet Take 1 tablet (10 mg total) by mouth daily.   Blood Glucose Monitoring Suppl DEVI 1 each by Does not apply route in the morning, at noon, and at bedtime. May substitute to any manufacturer covered by patient's insurance.   buPROPion (ZYBAN) 150 MG 12 hr tablet Take 1 tablet (150 mg total) by mouth 2 (two) times daily.   fluticasone -salmeterol (ADVAIR) 100-50 MCG/ACT AEPB Inhale 1 puff into the lungs 2 (two) times daily.   hydrochlorothiazide  (MICROZIDE ) 12.5 MG capsule Take 1 capsule (12.5 mg total) by mouth  daily.   ibuprofen  (ADVIL ) 800 MG tablet Take 1 tablet (800 mg total) by mouth 3 (three) times daily.   loratadine  (CLARITIN ) 10 MG tablet Take 1 tablet (10 mg total) by mouth at bedtime as needed for allergies or rhinitis.   losartan -hydrochlorothiazide  (HYZAAR) 100-25 MG tablet Take 1 tablet by mouth daily.   metFORMIN  (GLUCOPHAGE ) 500 MG tablet Take 1 tablet (500 mg total) by mouth 2 (two) times daily with a meal.   oxyCODONE -acetaminophen  (PERCOCET) 10-325 MG tablet Take 1 tablet by mouth every 4 (four) hours as needed for pain.   traZODone  (DESYREL ) 50 MG tablet TAKE 1 TABLET BY MOUTH EVERYDAY AT BEDTIME    varenicline  (CHANTIX ) 1 MG tablet Take 1 mg by mouth 2 (two) times daily.   [DISCONTINUED] pantoprazole  (PROTONIX ) 40 MG tablet Take 1 tablet (40 mg total) by mouth daily.   [DISCONTINUED] azithromycin  (ZITHROMAX ) 250 MG tablet Take 1 tablet (250 mg total) by mouth daily.   [DISCONTINUED] omeprazole  (PRILOSEC ) 20 MG capsule Take 20 mg by mouth 2 (two) times daily.   [DISCONTINUED] predniSONE  (DELTASONE ) 20 MG tablet 3 tablets daily x 4 days   [DISCONTINUED] silver  sulfADIAZINE  (SILVADENE ) 1 % cream Apply 1 Application topically daily.   No facility-administered medications prior to visit.    Review of Systems  All other systems reviewed and are negative.  All negative Except see HPI       Objective    BP (!) 170/124   Pulse 94   Resp 20   Ht 6' 1 (1.854 m)   Wt (!) 311 lb (141.1 kg)   SpO2 98%   BMI 41.03 kg/m     Physical Exam Vitals reviewed.  Constitutional:      General: He is not in acute distress.    Appearance: Normal appearance. He is not diaphoretic.  HENT:     Head: Normocephalic and atraumatic.  Eyes:     General: No scleral icterus.    Conjunctiva/sclera: Conjunctivae normal.  Cardiovascular:     Rate and Rhythm: Normal rate and regular rhythm.     Pulses: Normal pulses.     Heart sounds: Normal heart sounds. No murmur heard. Pulmonary:     Effort: Pulmonary effort is normal. No respiratory distress.     Breath sounds: Normal breath sounds. No wheezing or rhonchi.  Musculoskeletal:     Cervical back: Neck supple.     Right lower leg: No edema.     Left lower leg: No edema.  Lymphadenopathy:     Cervical: No cervical adenopathy.  Skin:    General: Skin is warm and dry.     Findings: No rash.  Neurological:     Mental Status: He is alert and oriented to person, place, and time. Mental status is at baseline.  Psychiatric:        Mood and Affect: Mood normal.        Behavior: Behavior normal.      No results found for any visits on  10/01/23.      Assessment & Plan Foot pain/Fracture of foot Fracture with ongoing pain, healing may be delayed due to diabetes. Prefers to avoid narcotics, concerns about ibuprofen  causing stomach issues. - Prescribed Celebrex  200 mg twice daily with meals. - Advised to take medications with food. - Encouraged to avoid excessive narcotics. Follow-up with ortho and vascular surgeon  Type 2 diabetes mellitus Chronic Diabetes may affect fracture healing. Currently on metformin . - Perform blood work from 07/2023 by Friday to  monitor diabetes. Continue low carb diet and regular exercise Will follow-up  Primary hypertension Chronic and unstable Elevated blood pressure, possibly due to pain and smoking. On amlodipine , atorvastatin , losartan , and hydrochlorothiazide . - Recheck blood pressure today. - Advised daily blood pressure monitoring and bring readings to next appointment. - Schedule follow-up on Monday if blood pressure remains elevated. Encouraged smoking cessation  Gastroesophageal reflux disease (GERD) Chronic and unstable Persistent acid reflux, possibly worsened by dietary choices and ibuprofen . - Prescribed pantoprazole . RTC if symptoms persist Will follow-up  tobacco dependence, cigarettes Chronic Continues smoking, impacting blood pressure and fracture healing. - Advised to reduce smoking. Will revisit  Mental health support Feels depressed and inactive due to foot injury, declines counseling. - Offered clinic counseling services.  Gastroesophageal reflux disease without esophagitis - pantoprazole  (PROTONIX ) 40 MG tablet; Take 1 tablet (40 mg total) by mouth daily.  Dispense: 90 tablet; Refill: 2  Diabetes mellitus without complication (HCC) - Urine Microalbumin w/creat. ratio  Primary hypertension (Primary) - Urine Microalbumin w/creat. ratio - pantoprazole  (PROTONIX ) 40 MG tablet; Take 1 tablet (40 mg total) by mouth daily.  Dispense: 90 tablet; Refill:  2 - celecoxib  (CELEBREX ) 200 MG capsule; Take 1 capsule (200 mg total) by mouth 2 (two) times daily.  Dispense: 60 capsule; Refill: 1  Morbid obesity (HCC) Chronic Stable Body mass index is 41.03 kg/m. Weight loss of 5% of pt's current weight via healthy diet and daily exercise encouraged. Labwork pending from 7 Will follow-up/2025  Hypercholesteremia Chronic and previously unstable Continue lifestyle modifications Continue atorvastatin  10mg  daily Will follow-up  Should complete labwork from 07/2023/cbc, cmp, lp, A1c...  Orders Placed This Encounter  Procedures   Urine Microalbumin w/creat. ratio    Return for on Monday vs in 2 weeks for BP.   The patient was advised to call back or seek an in-person evaluation if the symptoms worsen or if the condition fails to improve as anticipated.  I discussed the assessment and treatment plan with the patient. The patient was provided an opportunity to ask questions and all were answered. The patient agreed with the plan and demonstrated an understanding of the instructions.  I, Kingson Lohmeyer, PA-C have reviewed all documentation for this visit. The documentation on 10/01/2023  for the exam, diagnosis, procedures, and orders are all accurate and complete.  Jolynn Spencer, [facility], MMS [facility] [phone] (phone) [phone] (fax)  [facility]

## 2023-10-02 LAB — CBC WITH DIFFERENTIAL/PLATELET
Basophils Absolute: 0.1 x10E3/uL (ref 0.0–0.2)
Basos: 1 %
EOS (ABSOLUTE): 0.3 x10E3/uL (ref 0.0–0.4)
Eos: 2 %
Hematocrit: 40.2 % (ref 37.5–51.0)
Hemoglobin: 12.7 g/dL — ABNORMAL LOW (ref 13.0–17.7)
Immature Grans (Abs): 0.1 x10E3/uL (ref 0.0–0.1)
Immature Granulocytes: 1 %
Lymphocytes Absolute: 2.2 x10E3/uL (ref 0.7–3.1)
Lymphs: 17 %
MCH: 26.5 pg — ABNORMAL LOW (ref 26.6–33.0)
MCHC: 31.6 g/dL (ref 31.5–35.7)
MCV: 84 fL (ref 79–97)
Monocytes Absolute: 1.3 x10E3/uL — ABNORMAL HIGH (ref 0.1–0.9)
Monocytes: 10 %
Neutrophils Absolute: 9.3 x10E3/uL — ABNORMAL HIGH (ref 1.4–7.0)
Neutrophils: 69 %
Platelets: 336 x10E3/uL (ref 150–450)
RBC: 4.79 x10E6/uL (ref 4.14–5.80)
RDW: 16 % — ABNORMAL HIGH (ref 11.6–15.4)
WBC: 13.2 x10E3/uL — ABNORMAL HIGH (ref 3.4–10.8)

## 2023-10-02 LAB — COMPREHENSIVE METABOLIC PANEL WITH GFR
ALT: 25 IU/L (ref 0–44)
AST: 25 IU/L (ref 0–40)
Albumin: 4.1 g/dL (ref 4.1–5.1)
Alkaline Phosphatase: 105 IU/L (ref 47–123)
BUN/Creatinine Ratio: 11 (ref 9–20)
BUN: 10 mg/dL (ref 6–24)
Bilirubin Total: 0.4 mg/dL (ref 0.0–1.2)
CO2: 24 mmol/L (ref 20–29)
Calcium: 9.6 mg/dL (ref 8.7–10.2)
Chloride: 103 mmol/L (ref 96–106)
Creatinine, Ser: 0.93 mg/dL (ref 0.76–1.27)
Globulin, Total: 2.6 g/dL (ref 1.5–4.5)
Glucose: 99 mg/dL (ref 70–99)
Potassium: 4.3 mmol/L (ref 3.5–5.2)
Sodium: 143 mmol/L (ref 134–144)
Total Protein: 6.7 g/dL (ref 6.0–8.5)
eGFR: 101 mL/min/1.73 (ref >59)

## 2023-10-02 LAB — TSH: TSH: 1.64 u[IU]/mL (ref 0.450–4.500)

## 2023-10-02 LAB — MICROALBUMIN / CREATININE URINE RATIO
Creatinine, Urine: 187.5 mg/dL
Microalb/Creat Ratio: 104 mg/g{creat} — ABNORMAL HIGH (ref 0–29)
Microalbumin, Urine: 194.6 ug/mL

## 2023-10-02 LAB — LIPID PANEL
Chol/HDL Ratio: 2.7 ratio (ref 0.0–5.0)
Cholesterol, Total: 173 mg/dL (ref 100–199)
HDL: 63 mg/dL (ref >39)
LDL Chol Calc (NIH): 81 mg/dL (ref 0–99)
Triglycerides: 174 mg/dL — ABNORMAL HIGH (ref 0–149)
VLDL Cholesterol Cal: 29 mg/dL (ref 5–40)

## 2023-10-02 LAB — HEMOGLOBIN A1C
Est. average glucose Bld gHb Est-mCnc: 148 mg/dL
Hgb A1c MFr Bld: 6.8 % — ABNORMAL HIGH (ref 4.8–5.6)

## 2023-10-02 LAB — SPECIMEN STATUS REPORT

## 2023-10-03 ENCOUNTER — Ambulatory Visit: Payer: Self-pay | Admitting: Physician Assistant

## 2023-10-04 IMAGING — CR DG LUMBAR SPINE 2-3V
3 series · 3 of 3 positions shown · non-contrast
Comparison: X-ray lumbar spine 04/12/2017

CLINICAL DATA: Status post fall

EXAM:
LUMBAR SPINE - 2-3 VIEW

[l-spine ap]
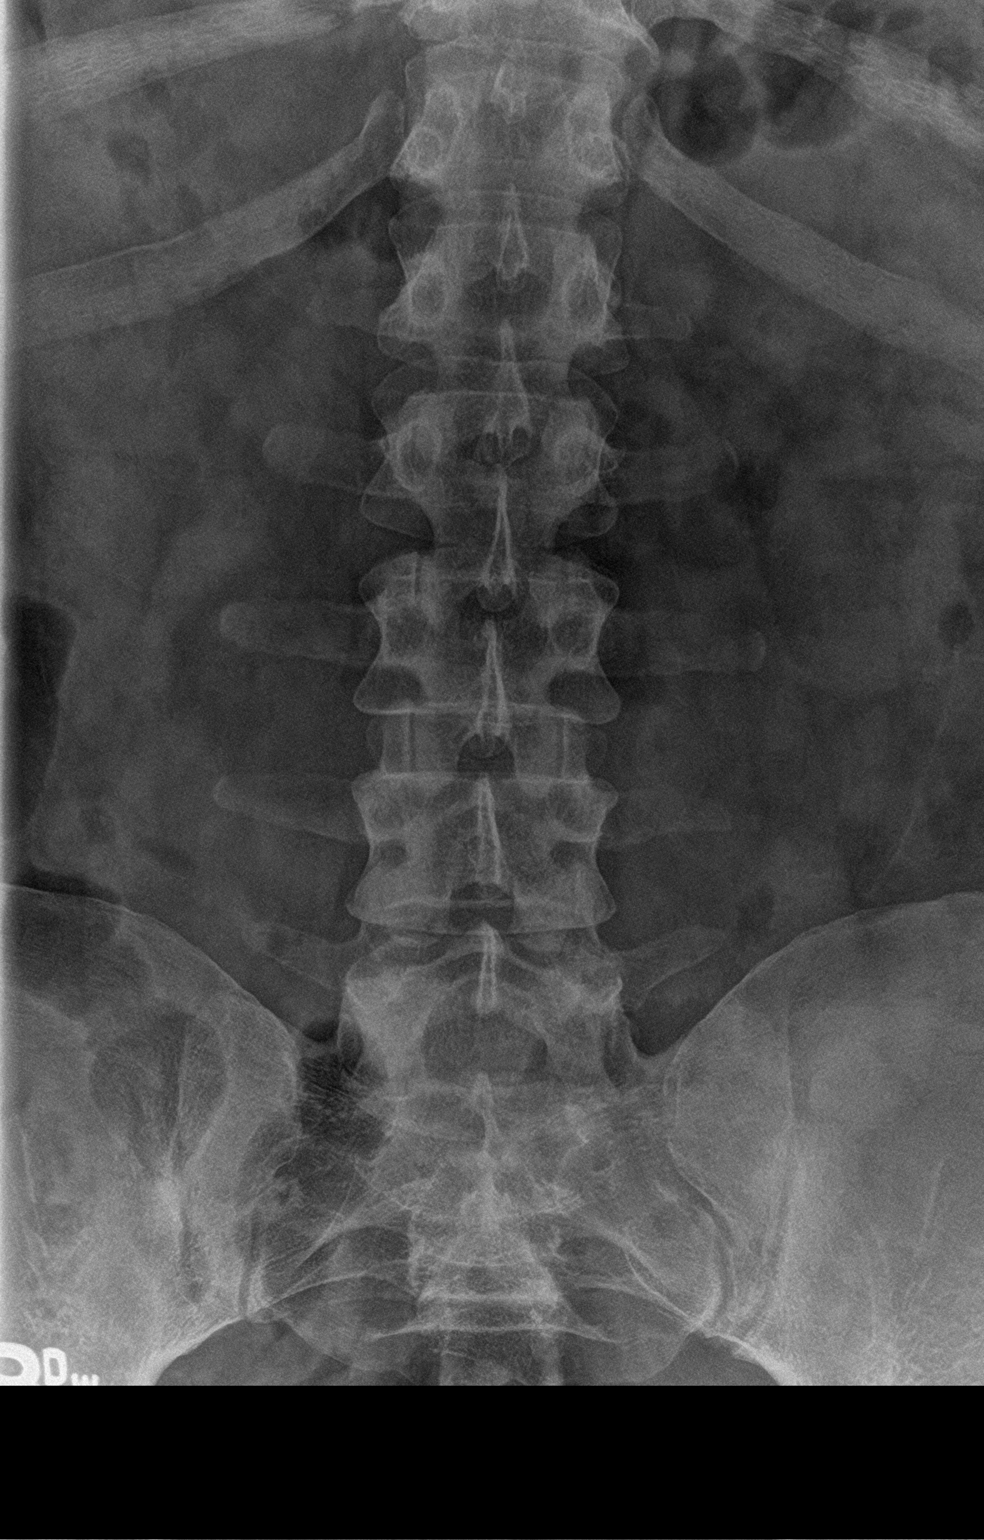

[l-spine lat]
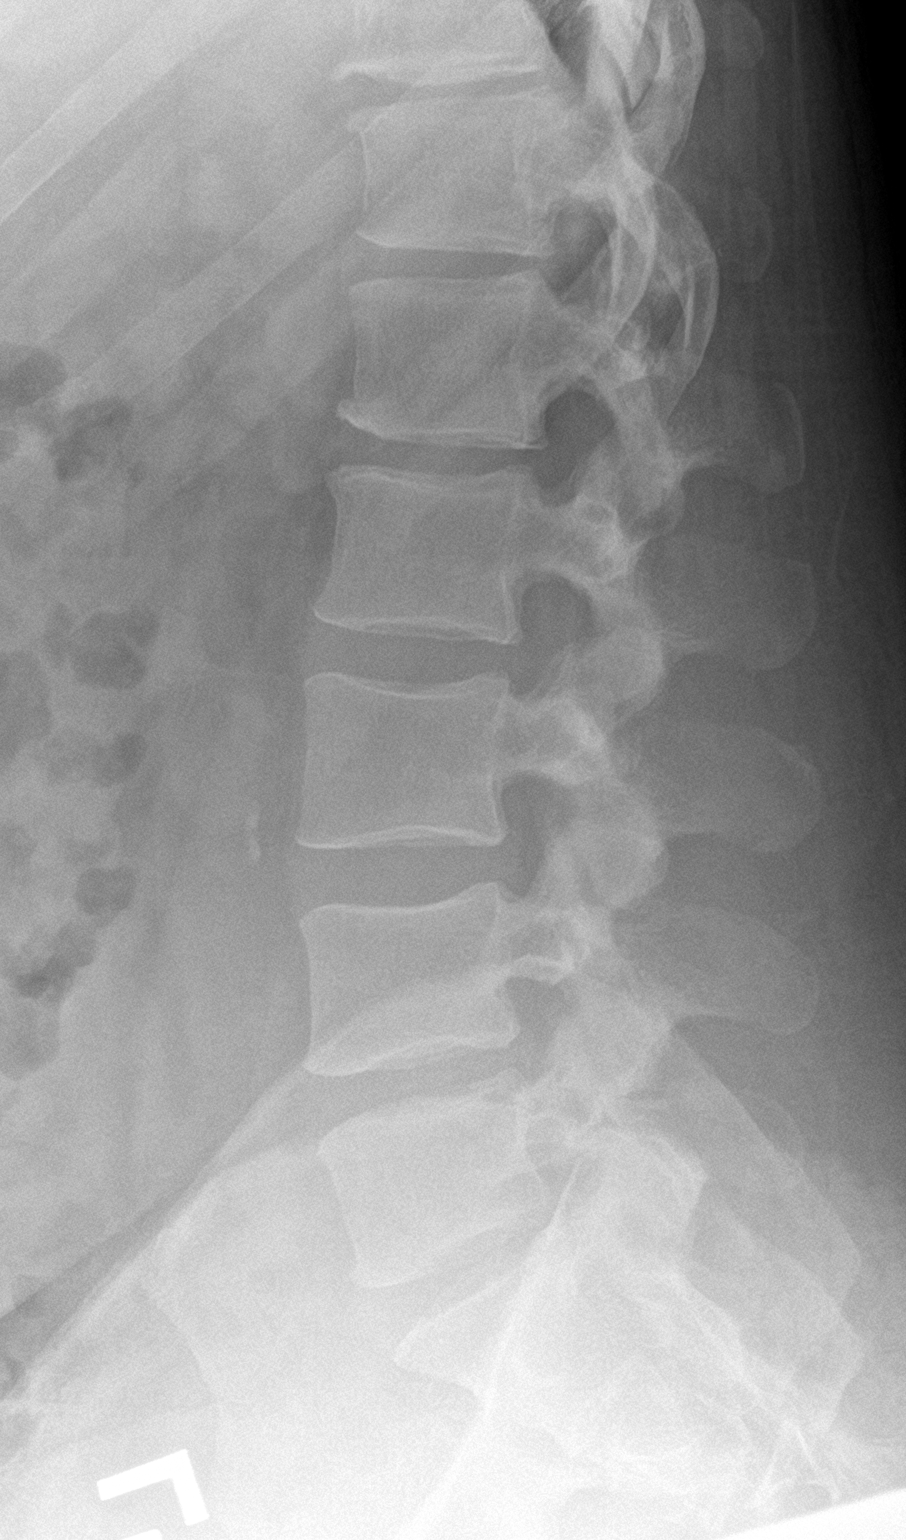

[l-spine spot]
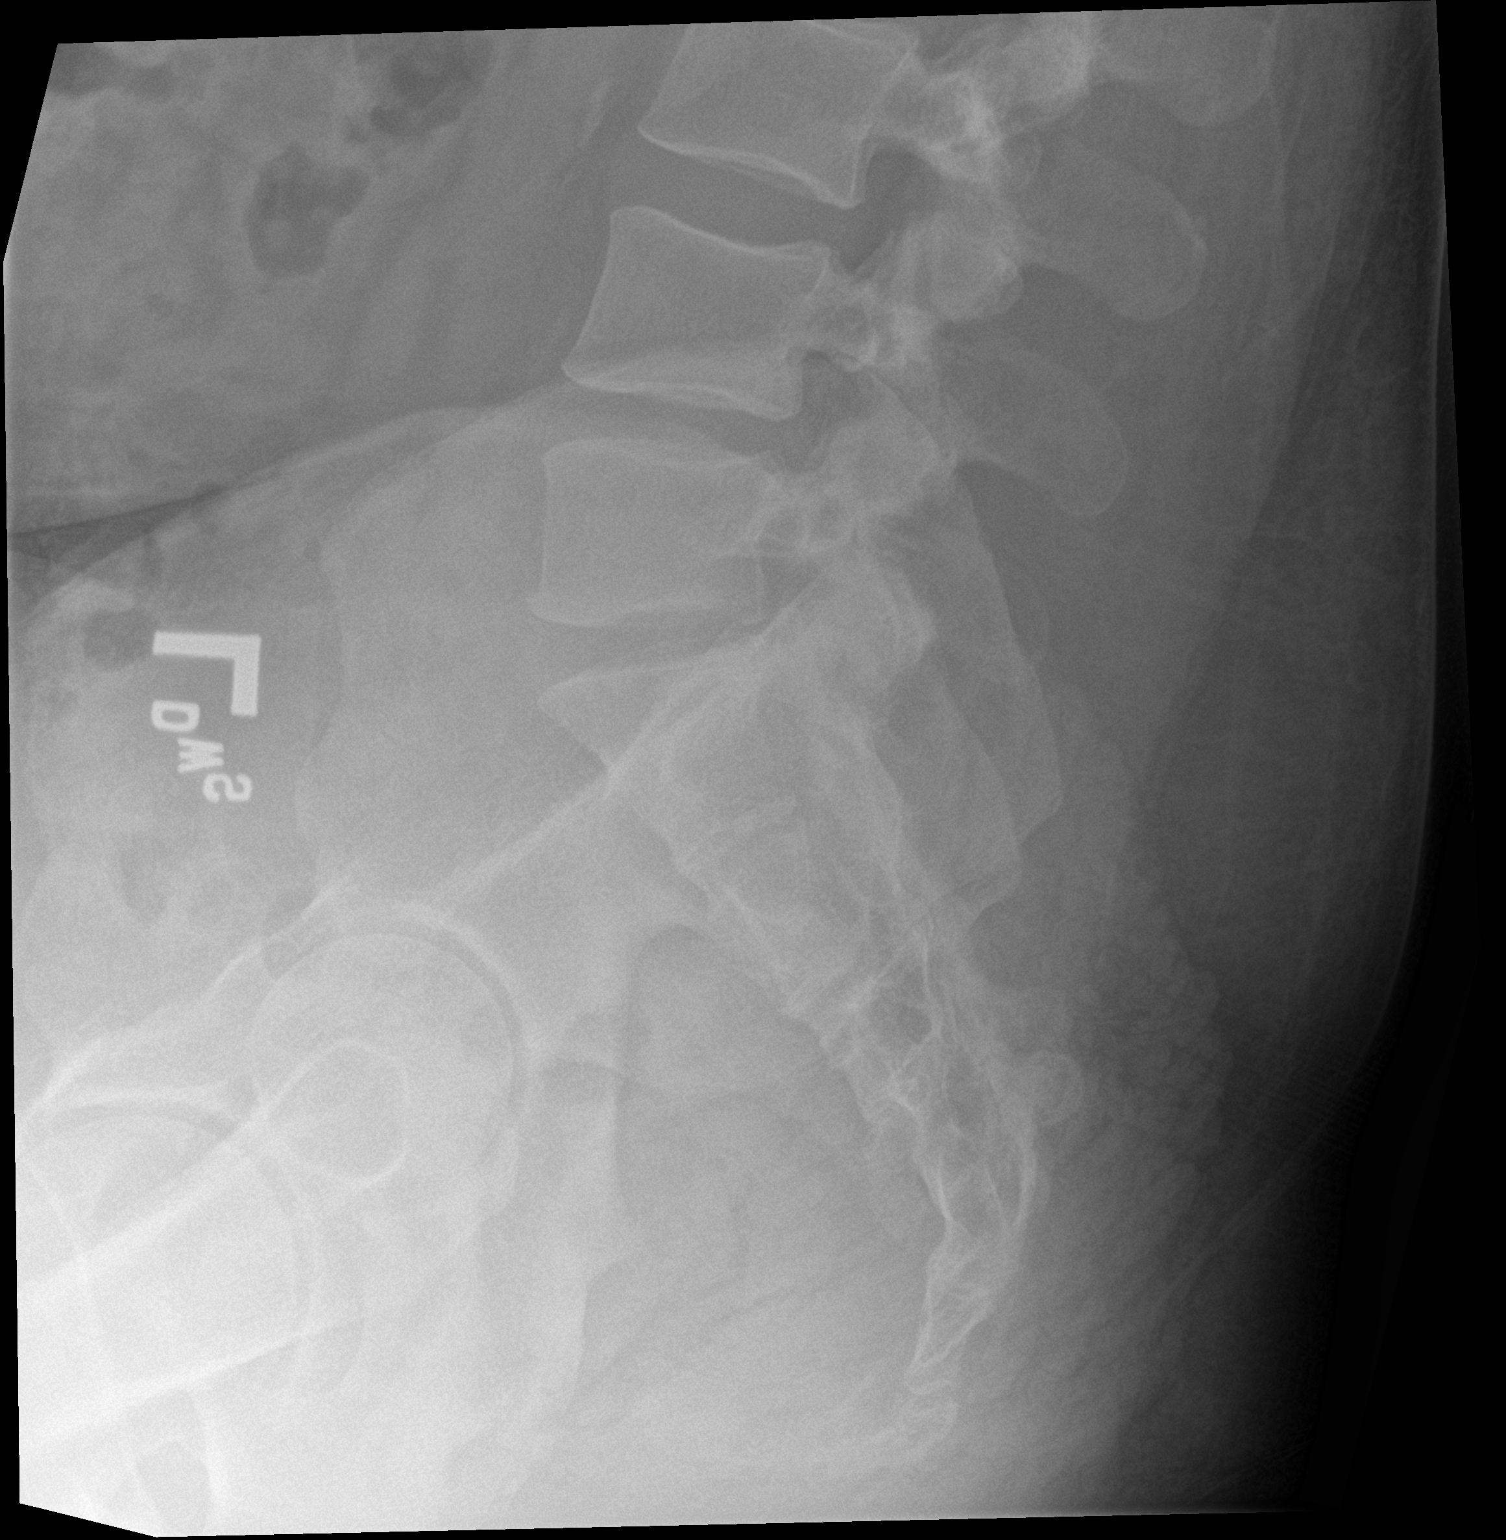

[3 of 3 positions shown; findings below may reference images not displayed]

FINDINGS: Five non-rib-bearing lumbar vertebral bodies. Mild degenerative
changes of the spine. There is no evidence of lumbar spine fracture.
Alignment is normal. Intervertebral disc spaces are maintained.
Atherosclerotic plaque of the aorta.
IMPRESSION: 1. No acute displaced fracture or traumatic listhesis of the lumbar
spine.
2.  Aortic Atherosclerosis (MBBM6-C9Q.Q).

## 2023-10-06 ENCOUNTER — Ambulatory Visit: Admitting: Physician Assistant

## 2023-10-06 ENCOUNTER — Encounter: Payer: Self-pay | Admitting: Physician Assistant

## 2023-10-06 VITALS — BP 174/122 | HR 102 | Resp 16 | Ht 73.0 in | Wt 311.0 lb

## 2023-10-06 DIAGNOSIS — R2242 Localized swelling, mass and lump, left lower limb: Secondary | ICD-10-CM | POA: Diagnosis not present

## 2023-10-06 MED ORDER — SULFAMETHOXAZOLE-TRIMETHOPRIM 800-160 MG PO TABS: 1.0000 | ORAL_TABLET | Freq: Two times a day (BID) | ORAL | 0 refills | Status: AC

## 2023-10-06 MED ORDER — IBUPROFEN 800 MG PO TABS
800.0000 mg | ORAL_TABLET | Freq: Three times a day (TID) | ORAL | 0 refills | Status: AC
Start: 2023-10-06 — End: ?

## 2023-10-06 NOTE — Progress Notes (Signed)
 Established patient visit  Patient: Curtis [name]   DOB: [birth year]   47 y.o. Male  MRN: [id number] Visit Date: 10/06/2023  Today's healthcare provider: Jolynn Spencer, PA-C   Chief Complaint  Patient presents with   Hypertension    Has been taking his medication. Unable to find his bp machine to monitor.   Subjective     HPI     Hypertension    Additional comments: Has been taking his medication. Unable to find his bp machine to monitor.      Last edited by Wilfred Hargis RAMAN, CMA on 10/06/2023  2:48 PM.       Discussed the use of AI scribe software for clinical note transcription with the patient, who gave verbal consent to proceed.  History of Present Illness Curtis [name] is a 48 year old male who presents with persistent foot pain following a work-related injury.  He experiences severe foot pain, described as a constant tightening sensation, significantly impacting daily activities and sleep. The injury occurred at work when he crushed his foot, resulting in a fracture. An orthopedic evaluation indicated stable fracture and no DVT on Doppler study. He uses Silvadene  cream for a superficial fracture blister wound, applying it with non-adhesive gauze and an ACE wrap. He has run out of oxycodone  and currently uses ibuprofen  for pain.  Swelling in the injured foot has decreased but was previously severe. The other foot is swelling, possibly due to compensatory weight bearing. He has not been prescribed antibiotics and is unsure about any blood work related to the injury.  He has diabetes and takes blood pressure medication and diuretics. He has no history of gout and no known allergies to antibiotics.       10/01/2023    2:41 PM 02/13/2023    8:11 AM 10/03/2021   10:11 AM  Depression screen PHQ 2/9  Decreased Interest 0 0 0  Down, Depressed, Hopeless 0 0 0  PHQ - 2 Score 0 0 0  Altered sleeping  2 0  Tired, decreased energy  2 0  Change in appetite  0 0  Feeling  bad or failure about yourself   0 0  Trouble concentrating  0 0  Moving slowly or fidgety/restless  0 0  Suicidal thoughts  0 0  PHQ-9 Score  4 0  Difficult doing work/chores  Not difficult at all Not difficult at all      02/13/2023    8:11 AM  GAD 7 : Generalized Anxiety Score  Nervous, Anxious, on Edge 0  Control/stop worrying 0  Worry too much - different things 0  Trouble relaxing 0  Restless 0  Easily annoyed or irritable 0  Afraid - awful might happen 0  Total GAD 7 Score 0    Medications: Outpatient Medications Prior to Visit  Medication Sig   albuterol  (VENTOLIN  HFA) 108 (90 Base) MCG/ACT inhaler Inhale 1-2 puffs into the lungs every 6 (six) hours as needed for wheezing or shortness of breath.   amLODipine  (NORVASC ) 5 MG tablet Take 1 tablet (5 mg total) by mouth daily.   atorvastatin  (LIPITOR) 10 MG tablet Take 1 tablet (10 mg total) by mouth daily.   Blood Glucose Monitoring Suppl DEVI 1 each by Does not apply route in the morning, at noon, and at bedtime. May substitute to any manufacturer covered by patient's insurance.   buPROPion (ZYBAN) 150 MG 12 hr tablet Take 1 tablet (150 mg total) by mouth 2 (two) times daily.  celecoxib  (CELEBREX ) 200 MG capsule Take 1 capsule (200 mg total) by mouth 2 (two) times daily.   fluticasone -salmeterol (ADVAIR) 100-50 MCG/ACT AEPB Inhale 1 puff into the lungs 2 (two) times daily.   hydrochlorothiazide  (MICROZIDE ) 12.5 MG capsule Take 1 capsule (12.5 mg total) by mouth daily.   ibuprofen  (ADVIL ) 800 MG tablet Take 1 tablet (800 mg total) by mouth 3 (three) times daily.   loratadine  (CLARITIN ) 10 MG tablet Take 1 tablet (10 mg total) by mouth at bedtime as needed for allergies or rhinitis.   losartan -hydrochlorothiazide  (HYZAAR) 100-25 MG tablet Take 1 tablet by mouth daily.   metFORMIN  (GLUCOPHAGE ) 500 MG tablet Take 1 tablet (500 mg total) by mouth 2 (two) times daily with a meal.   oxyCODONE -acetaminophen  (PERCOCET) 10-325 MG tablet  Take 1 tablet by mouth every 4 (four) hours as needed for pain.   pantoprazole  (PROTONIX ) 40 MG tablet Take 1 tablet (40 mg total) by mouth daily.   traZODone  (DESYREL ) 50 MG tablet TAKE 1 TABLET BY MOUTH EVERYDAY AT BEDTIME   varenicline  (CHANTIX ) 1 MG tablet Take 1 mg by mouth 2 (two) times daily.   No facility-administered medications prior to visit.    Review of Systems All negative Except see HPI       Objective    BP (!) 174/122   Pulse (!) 102   Resp 16   Ht 6' 1 (1.854 m)   Wt (!) 311 lb (141.1 kg)   SpO2 96%   BMI 41.03 kg/m     Physical Exam Vitals reviewed.  Constitutional:      General: He is not in acute distress.    Appearance: Normal appearance. He is not diaphoretic.  HENT:     Head: Normocephalic and atraumatic.  Eyes:     General: No scleral icterus.    Conjunctiva/sclera: Conjunctivae normal.  Cardiovascular:     Rate and Rhythm: Normal rate and regular rhythm.     Pulses: Normal pulses.     Heart sounds: Normal heart sounds. No murmur heard. Pulmonary:     Effort: Pulmonary effort is normal. No respiratory distress.     Breath sounds: Normal breath sounds. No wheezing or rhonchi.  Musculoskeletal:        General: Swelling, tenderness and signs of injury present.     Cervical back: Neck supple.     Right lower leg: No edema.     Left lower leg: Edema present.  Lymphadenopathy:     Cervical: No cervical adenopathy.  Skin:    General: Skin is warm and dry.     Findings: Lesion (photo in the media) present. No rash.  Neurological:     Mental Status: He is alert and oriented to person, place, and time. Mental status is at baseline.  Psychiatric:        Mood and Affect: Mood normal.        Behavior: Behavior normal.      No results found for any visits on 10/06/23.       Assessment & Plan Acute left foot fracture with wound and pain Acute fracture with significant pain and swelling. Doppler studies negative for vascular ischemia and  DVT. Wound not infected but risk due to diabetes. - Apply Silvadene  cream with nonadhesive gauze and ACE wrap. - Order CBC to assess for infection. - Prescribe antibiotics if CBC indicates infection. - Contact orthopedic office for earlier appointment or cancellation list/prescription of pain med - Clean wound, apply ice wrap and nonstick  gauze.  Type 2 diabetes mellitus Chronic Continue taking metformin  Diabetes may complicate wound and fracture healing. Advised lifestyle modifications Considering DM, bactrim  was called in for covering infection Will follow-up  Insomnia Difficulty sleeping due to foot fracture pain. Will revisit   No orders of the defined types were placed in this encounter.   No follow-ups on file.   The patient was advised to call back or seek an in-person evaluation if the symptoms worsen or if the condition fails to improve as anticipated.  I discussed the assessment and treatment plan with the patient. The patient was provided an opportunity to ask questions and all were answered. The patient agreed with the plan and demonstrated an understanding of the instructions.  I, Curtis Figueira, PA-C have reviewed all documentation for this visit. The documentation on 10/06/2023  for the exam, diagnosis, procedures, and orders are all accurate and complete.  Jolynn Spencer, [facility], MMS [facility] [phone] (phone) [phone] (fax)  [facility]

## 2023-10-07 ENCOUNTER — Ambulatory Visit: Payer: Self-pay | Admitting: Physician Assistant

## 2023-10-07 DIAGNOSIS — M79605 Pain in left leg: Secondary | ICD-10-CM

## 2023-10-07 DIAGNOSIS — R2242 Localized swelling, mass and lump, left lower limb: Secondary | ICD-10-CM

## 2023-10-07 DIAGNOSIS — S81802D Unspecified open wound, left lower leg, subsequent encounter: Secondary | ICD-10-CM

## 2023-10-07 LAB — CBC WITH DIFFERENTIAL/PLATELET
Basophils Absolute: 0.1 x10E3/uL (ref 0.0–0.2)
Basos: 0 %
EOS (ABSOLUTE): 0.3 x10E3/uL (ref 0.0–0.4)
Eos: 2 %
Hematocrit: 43 % (ref 37.5–51.0)
Hemoglobin: 13.3 g/dL (ref 13.0–17.7)
Immature Grans (Abs): 0.1 x10E3/uL (ref 0.0–0.1)
Immature Granulocytes: 1 %
Lymphocytes Absolute: 2.1 x10E3/uL (ref 0.7–3.1)
Lymphs: 12 %
MCH: 26.1 pg — ABNORMAL LOW (ref 26.6–33.0)
MCHC: 30.9 g/dL — ABNORMAL LOW (ref 31.5–35.7)
MCV: 84 fL (ref 79–97)
Monocytes Absolute: 1.5 x10E3/uL — ABNORMAL HIGH (ref 0.1–0.9)
Monocytes: 9 %
Neutrophils Absolute: 12.9 x10E3/uL — ABNORMAL HIGH (ref 1.4–7.0)
Neutrophils: 76 %
Platelets: 369 x10E3/uL (ref 150–450)
RBC: 5.1 x10E6/uL (ref 4.14–5.80)
RDW: 15.5 % — ABNORMAL HIGH (ref 11.6–15.4)
WBC: 16.9 x10E3/uL — ABNORMAL HIGH (ref 3.4–10.8)

## 2023-10-07 LAB — URIC ACID: Uric Acid: 5.2 mg/dL (ref 3.8–8.4)

## 2023-10-07 LAB — SEDIMENTATION RATE: Sed Rate: 47 mm/h — ABNORMAL HIGH (ref 0–15)

## 2023-10-07 LAB — C-REACTIVE PROTEIN: CRP: 19 mg/L — ABNORMAL HIGH (ref 0–10)

## 2023-10-14 ENCOUNTER — Telehealth: Payer: Self-pay | Admitting: Lab

## 2023-10-14 ENCOUNTER — Other Ambulatory Visit: Payer: Self-pay | Admitting: Podiatry

## 2023-10-14 MED ORDER — OXYCODONE-ACETAMINOPHEN 10-325 MG PO TABS: 1.0000 | ORAL_TABLET | ORAL | 0 refills | Status: DC | PRN

## 2023-10-14 NOTE — Telephone Encounter (Signed)
 Patient is need of refill on oxycodone .

## 2023-10-14 NOTE — Telephone Encounter (Signed)
 Provider has already issued a refill on this medication.

## 2023-10-14 NOTE — Progress Notes (Signed)
PRN pain 

## 2023-10-17 ENCOUNTER — Ambulatory Visit: Admitting: Podiatry

## 2023-10-21 ENCOUNTER — Ambulatory Visit (INDEPENDENT_AMBULATORY_CARE_PROVIDER_SITE_OTHER)

## 2023-10-21 ENCOUNTER — Encounter: Payer: Self-pay | Admitting: Podiatry

## 2023-10-21 ENCOUNTER — Ambulatory Visit: Payer: Worker's Compensation | Admitting: Podiatry

## 2023-10-21 VITALS — Ht 73.0 in | Wt 311.0 lb

## 2023-10-21 DIAGNOSIS — S82832A Other fracture of upper and lower end of left fibula, initial encounter for closed fracture: Secondary | ICD-10-CM | POA: Diagnosis not present

## 2023-10-21 MED ORDER — GABAPENTIN 100 MG PO CAPS: 100.0000 mg | ORAL_CAPSULE | Freq: Three times a day (TID) | ORAL | 1 refills | Status: DC

## 2023-10-21 MED ORDER — OXYCODONE-ACETAMINOPHEN 10-325 MG PO TABS: 1.0000 | ORAL_TABLET | Freq: Four times a day (QID) | ORAL | 0 refills | Status: DC | PRN

## 2023-10-21 NOTE — Patient Instructions (Signed)
 Voltaren (diclofenac) gel or cream

## 2023-10-21 NOTE — Addendum Note (Signed)
 Addended by: JANIT THRESA HERO on: 10/21/2023 02:21 PM   Modules accepted: Orders

## 2023-10-21 NOTE — Progress Notes (Addendum)
 Chief Complaint  Patient presents with   Ankle Injury    Pt is here to f/u on left ankle he states that he ankle is still swollen and having pain, continues to use knee scooter to ambulate, and tries to stay off ankle as much as possible.     HPI: 48 y.o. male presenting today for follow-up evaluation regarding work-related injury.  Ankle fracture left.  Overall there has been some improvement of the pain.  He is currently nonweightbearing using the knee scooter.    Brief history: DOI: 09/02/2023.  Patient states that he caught his left foot and ankle between a wall and a forklift and was crushed.  He went to the emergency department diagnosed with comminuted nondisplaced fracture of the lateral fibular malleolus.    Past Medical History:  Diagnosis Date   Allergies    Hypertension     Past Surgical History:  Procedure Laterality Date   COLONOSCOPY WITH PROPOFOL  N/A 11/08/2021   Procedure: COLONOSCOPY WITH PROPOFOL ;  Surgeon: Therisa Bi, MD;  Location: [facility] ENDOSCOPY;  Service: Gastroenterology;  Laterality: N/A;    No Known Allergies   Physical Exam: General: The patient is alert and oriented x3 in no acute distress.  Dermatology: Superficial wounds noted from the previously mentioned fracture blisters.  They appear very superficial and stable. Discoloration of the lower extremity also noted concerning for possible vascular compromise  Vascular:  VAS US  LOWER EXTREMITY ARTERIAL DUPLEX (Accession [id number]) (Order [id number]) Vascular Ultrasound Date: 09/30/2023 Department: [facility] Released By: Wendee Harlene CROME Authorizing: Janit Thresa HERO, DPM  +----------+--------+-----+--------+---------+--------+  LEFT     PSV cm/sRatioStenosisWaveform Comments  +----------+--------+-----+--------+---------+--------+  CFA Mid   111                  triphasic          +----------+--------+-----+--------+---------+--------+  DFA      41                    triphasic          +----------+--------+-----+--------+---------+--------+  SFA Prox  78                   triphasic          +----------+--------+-----+--------+---------+--------+  SFA Mid   60                   triphasic          +----------+--------+-----+--------+---------+--------+  SFA Distal97                   triphasic          +----------+--------+-----+--------+---------+--------+  POP Distal64                   triphasic          +----------+--------+-----+--------+---------+--------+  ATA Distal87                   triphasic          +----------+--------+-----+--------+---------+--------+  PTA Distal67                   triphasic          +----------+--------+-----+--------+---------+--------+  Summary: Left: Normal flow throughout. No evidence of atherosclerosis.   VAS US  LOWER EXTREMITY VENOUS (DVT) (Accession [id number]) (Order [id number]) Vascular Ultrasound Date: 09/30/2023 Department: [facility] Released By: Wendee Harlene CROME Authorizing: Janit Thresa HERO, DPM  LEFT: -  No evidence of deep vein thrombosis in the lower extremity. No indirect evidence of obstruction proximal to the inguinal ligament.   Neurological: Grossly intact via light touch  Musculoskeletal Exam: Gross alignment of the foot and ankle.    Radiographic exam LT ankle 10/21/2023: Unchanged.  The avulsed distal fibula fracture is essentially unchanged with modest improvement in signs of healing.  No malalignment  DG Foot Complete Left 09/02/2023 IMPRESSION: Questionable nondisplaced lateral malleolar fracture. This could be further evaluated with CT if felt clinically indicated. Diffuse soft tissue swelling about the ankle.  DG Ankle Complete Left 09/02/2023 IMPRESSION: Questionable nondisplaced lateral malleolar fracture. This could be further evaluated with CT if felt clinically indicated. Diffuse soft tissue swelling about the  ankle.  CT Ankle Left Wo Contrast 09/02/2023 IMPRESSION: 1. Acute mildly comminuted nondisplaced fracture involving the lateral fibular malleolus below the level of the mortise. 2. Acute nondisplaced fracture involving the sustentaculum tali of the calcaneus. 3. Considerable soft tissue edema at the lower leg, ankle and foot.   Assessment/Plan of Care: 1.  Nondisplaced comminuted ankle fracture fibular malleolus and sustentaculum tali calcaneus left.  DOI: 09/02/2023 2.  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  X-rays reviewed.  Stable - Vascular studies reviewed -Okay to begin WBAT in the cam boot. -Order placed for physical therapy at [facility] -Refill percocet 10/325mg  Q6h PRN pain -Rx gabapentin 100 mg 3 times daily for nerve pain -Recommend OTC Voltaren (diclofenac) 1% topical gel or cream to massage into the ankle daily to desensitize the light touch and nerve pain that he is experiencing -Return to clinic with me 8 weeks   Thresa EMERSON Sar, DPM [facility]  Dr. Thresa EMERSON Sar, DPM    [address] [place], [place] [address]                Office [phone]  Fax [phone]

## 2023-10-29 ENCOUNTER — Other Ambulatory Visit: Payer: Self-pay | Admitting: Podiatry

## 2023-10-29 ENCOUNTER — Telehealth: Payer: Self-pay | Admitting: Podiatry

## 2023-10-29 MED ORDER — OXYCODONE-ACETAMINOPHEN 10-325 MG PO TABS: 1.0000 | ORAL_TABLET | Freq: Four times a day (QID) | ORAL | 0 refills | Status: DC | PRN

## 2023-10-29 NOTE — Telephone Encounter (Signed)
 oxyCODONE -acetaminophen  (PERCOCET) 10-325 MG tablet -  Patient requesting a refill   gabapentin (NEURONTIN) 100 MG capsule - Patientt states this medication is not helping at all

## 2023-10-30 ENCOUNTER — Ambulatory Visit: Payer: Worker's Compensation | Admitting: Physical Therapy

## 2023-10-30 NOTE — Telephone Encounter (Signed)
 Refill sent 10/29/23

## 2023-11-03 ENCOUNTER — Ambulatory Visit: Admitting: Physician Assistant

## 2023-11-13 ENCOUNTER — Telehealth: Payer: Self-pay | Admitting: Lab

## 2023-11-13 NOTE — Telephone Encounter (Signed)
 Patient calling requesting pain medication refill.

## 2023-11-17 ENCOUNTER — Telehealth: Payer: Self-pay | Admitting: Lab

## 2023-11-17 ENCOUNTER — Telehealth: Payer: Self-pay | Admitting: Podiatry

## 2023-11-17 NOTE — Telephone Encounter (Signed)
 Patient called requesting pain medication.States called Friday with no response.

## 2023-11-17 NOTE — Telephone Encounter (Signed)
 lft mess about last visit and the next. Left on her vmail next visit is 12/23/23 Curtis [name] with [facility]

## 2023-11-18 ENCOUNTER — Other Ambulatory Visit: Payer: Self-pay | Admitting: Podiatry

## 2023-11-18 MED ORDER — OXYCODONE-ACETAMINOPHEN 5-325 MG PO TABS: 1.0000 | ORAL_TABLET | Freq: Three times a day (TID) | ORAL | 0 refills | Status: DC | PRN

## 2023-11-18 NOTE — Progress Notes (Signed)
PRN pain 

## 2023-11-21 ENCOUNTER — Telehealth: Payer: Self-pay | Admitting: Podiatry

## 2023-11-21 NOTE — Telephone Encounter (Signed)
 Sent mess to Dr Janit to confirm pt's work status. Diane WC is requesting

## 2023-11-23 ENCOUNTER — Other Ambulatory Visit: Payer: Self-pay | Admitting: Physician Assistant

## 2023-11-23 DIAGNOSIS — I152 Hypertension secondary to endocrine disorders: Secondary | ICD-10-CM

## 2023-11-24 ENCOUNTER — Encounter: Payer: Self-pay | Admitting: Podiatry

## 2023-11-24 NOTE — Telephone Encounter (Signed)
 Will fax [facility] [phone]  note that pt to remain out of work until after next appt 12/23/23 per Dr. Janit

## 2023-12-14 ENCOUNTER — Other Ambulatory Visit: Payer: Self-pay | Admitting: Podiatry

## 2023-12-23 ENCOUNTER — Encounter: Payer: Self-pay | Admitting: Podiatry

## 2023-12-23 ENCOUNTER — Telehealth: Payer: Self-pay | Admitting: Podiatry

## 2023-12-23 ENCOUNTER — Ambulatory Visit: Payer: Worker's Compensation | Admitting: Podiatry

## 2023-12-23 ENCOUNTER — Ambulatory Visit (INDEPENDENT_AMBULATORY_CARE_PROVIDER_SITE_OTHER)

## 2023-12-23 VITALS — Ht 73.0 in | Wt 311.0 lb

## 2023-12-23 DIAGNOSIS — S82832A Other fracture of upper and lower end of left fibula, initial encounter for closed fracture: Secondary | ICD-10-CM | POA: Diagnosis not present

## 2023-12-23 NOTE — Telephone Encounter (Signed)
 Patient is requesting a work note, please leave at front desk, he will pick note tomorrow, 12/24/23, Also patient would like to pick up order for medication from pharmacy as soon as possible. Patient has questions regarding information in MyChart.

## 2023-12-23 NOTE — Progress Notes (Unsigned)
 Chief Complaint  Patient presents with   Ankle Injury    Pt is here to f/u on left ankle fracture, he states that he is still in pain and having swelling to the ankle, complains of numbness to the heel of the foot, he continues to wear boot, no other complaints.    HPI: 48 y.o. male presenting today for follow-up evaluation regarding work-related injury.  Ankle fracture left.  Overall there has been some improvement of the pain.  He is currently nonweightbearing using the knee scooter.    Brief history: DOI: 09/02/2023.  Patient states that he caught his left foot and ankle between a wall and a forklift and was crushed.  He went to the emergency department diagnosed with comminuted nondisplaced fracture of the lateral fibular malleolus.    Past Medical History:  Diagnosis Date   Allergies    Hypertension     Past Surgical History:  Procedure Laterality Date   COLONOSCOPY WITH PROPOFOL  N/A 11/08/2021   Procedure: COLONOSCOPY WITH PROPOFOL ;  Surgeon: Therisa Bi, MD;  Location: [facility] ENDOSCOPY;  Service: Gastroenterology;  Laterality: N/A;    No Known Allergies   Physical Exam: General: The patient is alert and oriented x3 in no acute distress.  Dermatology: Superficial wounds noted from the previously mentioned fracture blisters.  They appear very superficial and stable. Discoloration of the lower extremity also noted concerning for possible vascular compromise  Vascular:  VAS US  LOWER EXTREMITY ARTERIAL DUPLEX (Accession [id number]) (Order [id number]) Vascular Ultrasound Date: 09/30/2023 Department: [facility] Released By: Wendee Harlene CROME Authorizing: Janit Thresa HERO, DPM  +----------+--------+-----+--------+---------+--------+  LEFT     PSV cm/sRatioStenosisWaveform Comments  +----------+--------+-----+--------+---------+--------+  CFA Mid   111                  triphasic          +----------+--------+-----+--------+---------+--------+   DFA      41                   triphasic          +----------+--------+-----+--------+---------+--------+  SFA Prox  78                   triphasic          +----------+--------+-----+--------+---------+--------+  SFA Mid   60                   triphasic          +----------+--------+-----+--------+---------+--------+  SFA Distal97                   triphasic          +----------+--------+-----+--------+---------+--------+  POP Distal64                   triphasic          +----------+--------+-----+--------+---------+--------+  ATA Distal87                   triphasic          +----------+--------+-----+--------+---------+--------+  PTA Distal67                   triphasic          +----------+--------+-----+--------+---------+--------+  Summary: Left: Normal flow throughout. No evidence of atherosclerosis.   VAS US  LOWER EXTREMITY VENOUS (DVT) (Accession [id number]) (Order [id number]) Vascular Ultrasound Date: 09/30/2023 Department: [facility] Released By: Wendee Harlene CROME Authorizing: Janit Thresa HERO, DPM  LEFT: - No evidence of deep vein thrombosis in the lower extremity. No indirect evidence of obstruction proximal to the inguinal ligament.   Neurological: Grossly intact via light touch  Musculoskeletal Exam: Gross alignment of the foot and ankle.    Radiographic exam LT ankle 10/21/2023: Unchanged.  The avulsed distal fibula fracture is essentially unchanged with modest improvement in signs of healing.  No malalignment  DG Foot Complete Left 09/02/2023 IMPRESSION: Questionable nondisplaced lateral malleolar fracture. This could be further evaluated with CT if felt clinically indicated. Diffuse soft tissue swelling about the ankle.  DG Ankle Complete Left 09/02/2023 IMPRESSION: Questionable nondisplaced lateral malleolar fracture. This could be further evaluated with CT if felt clinically indicated. Diffuse soft  tissue swelling about the ankle.  CT Ankle Left Wo Contrast 09/02/2023 IMPRESSION: 1. Acute mildly comminuted nondisplaced fracture involving the lateral fibular malleolus below the level of the mortise. 2. Acute nondisplaced fracture involving the sustentaculum tali of the calcaneus. 3. Considerable soft tissue edema at the lower leg, ankle and foot.   Assessment/Plan of Care: 1.  Nondisplaced comminuted ankle fracture fibular malleolus and sustentaculum tali calcaneus left.  DOI: 09/02/2023 2.  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  X-rays reviewed.  Stable - Vascular studies reviewed -Okay to begin WBAT in the cam boot. -Order placed for physical therapy at [facility] -Refill percocet 10/325mg  Q6h PRN pain -Rx gabapentin  100 mg 3 times daily for nerve pain -Recommend OTC Voltaren (diclofenac) 1% topical gel or cream to massage into the ankle daily to desensitize the light touch and nerve pain that he is experiencing -Return to clinic with me 8 weeks   Thresa EMERSON Sar, DPM [facility]  Dr. Thresa EMERSON Sar, DPM    [address] [place], [place] [address]                Office [phone]  Fax [phone]

## 2023-12-23 NOTE — Patient Instructions (Signed)

## 2023-12-24 ENCOUNTER — Ambulatory Visit: Admitting: Podiatry

## 2023-12-24 ENCOUNTER — Other Ambulatory Visit: Payer: Self-pay | Admitting: Podiatry

## 2023-12-24 ENCOUNTER — Telehealth: Payer: Self-pay | Admitting: Podiatry

## 2023-12-24 MED ORDER — OXYCODONE-ACETAMINOPHEN 5-325 MG PO TABS: 1.0000 | ORAL_TABLET | ORAL | 0 refills | Status: DC | PRN

## 2023-12-24 NOTE — Telephone Encounter (Signed)
 Patient is requesting a work note, please leave at front desk, he will pick up note on 12/25/23

## 2023-12-29 ENCOUNTER — Telehealth: Payer: Self-pay | Admitting: Podiatry

## 2023-12-29 NOTE — Telephone Encounter (Signed)
 Patient needs paperwork signed for his PT to continue. [employer] is going to send paperwork to [facility] office for Dr Janit to fill out.

## 2023-12-31 NOTE — Telephone Encounter (Signed)
 Mess lft from [facility] needing office visit notes 12/23/23 faxed to [phone]

## 2024-01-05 ENCOUNTER — Inpatient Hospital Stay: Admission: RE | Admit: 2024-01-05 | Discharge: 2024-01-05 | Payer: Worker's Compensation | Attending: Podiatry

## 2024-01-05 DIAGNOSIS — S82832A Other fracture of upper and lower end of left fibula, initial encounter for closed fracture: Secondary | ICD-10-CM

## 2024-02-05 ENCOUNTER — Telehealth: Payer: Self-pay | Admitting: Podiatry

## 2024-02-05 NOTE — Telephone Encounter (Signed)
 D Hicks- WC req office notets/MRI confirmation faxed [phone]

## 2024-02-06 ENCOUNTER — Telehealth: Payer: Self-pay | Admitting: Podiatry

## 2024-02-06 NOTE — Telephone Encounter (Signed)
 D Hicks called bk and left mess that all MRI have to be approved and scheduled by her since [facility]. I sent mess to [facility] to see who scheduled the one last one 12/2023 I faxed her the MRI and office notes and she indicated she needed to know when the follow up about this one will be. I fwd to [facility] for review

## 2024-02-09 ENCOUNTER — Emergency Department

## 2024-02-09 ENCOUNTER — Emergency Department
Admission: EM | Admit: 2024-02-09 | Discharge: 2024-02-09 | Disposition: A | Discharge status: Home / Self Care | Attending: Emergency Medicine | Admitting: Emergency Medicine

## 2024-02-09 ENCOUNTER — Other Ambulatory Visit: Payer: Self-pay

## 2024-02-09 DIAGNOSIS — S46002A Unspecified injury of muscle(s) and tendon(s) of the rotator cuff of left shoulder, initial encounter: Secondary | ICD-10-CM | POA: Diagnosis not present

## 2024-02-09 DIAGNOSIS — I10 Essential (primary) hypertension: Secondary | ICD-10-CM | POA: Diagnosis not present

## 2024-02-09 DIAGNOSIS — J45909 Unspecified asthma, uncomplicated: Secondary | ICD-10-CM | POA: Diagnosis not present

## 2024-02-09 DIAGNOSIS — S40012A Contusion of left shoulder, initial encounter: Secondary | ICD-10-CM | POA: Insufficient documentation

## 2024-02-09 DIAGNOSIS — W009XXA Unspecified fall due to ice and snow, initial encounter: Secondary | ICD-10-CM | POA: Insufficient documentation

## 2024-02-09 DIAGNOSIS — E119 Type 2 diabetes mellitus without complications: Secondary | ICD-10-CM | POA: Diagnosis not present

## 2024-02-09 MED ORDER — OXYCODONE-ACETAMINOPHEN 5-325 MG PO TABS
2.0000 | ORAL_TABLET | ORAL | Status: AC
  Administered 2024-02-09: 2 via ORAL
  Filled 2024-02-09: qty 2

## 2024-02-09 MED ORDER — OXYCODONE-ACETAMINOPHEN 5-325 MG PO TABS: 1.0000 | ORAL_TABLET | Freq: Four times a day (QID) | ORAL | 0 refills | Status: DC | PRN

## 2024-02-09 NOTE — Discharge Instructions (Addendum)
 No driving this evening or within 8 hours of use of oxycodone .  Use only as prescribed.  I recommend you use your sling for the next couple of days, but not long-term as we do not want to lead to a frozen shoulder  Follow-up with orthopedics, please call and schedule follow-up this week  Return if you have numbness weakness inability use the shoulder severe pain, redness swelling, or other new concerns arise.

## 2024-02-09 NOTE — ED Provider Notes (Signed)
 "  [facility] Provider Note    Event Date/Time   First MD Initiated Contact with Patient 02/09/24 2301     (approximate)   History   Fall   HPI  Curtis [name] is a 49 y.o. male history of hepatitis C asthma reflux type 2 diabetes Reviewed external records from podiatry December 9 patient being treated for left ankle fracture  Past Medical History:  Diagnosis Date   Allergies    Hypertension    Was walking out of his house this evening.  He was wearing slipper type shoes, he slipped on the ice.  He attempted to catch himself with his left hand but landed on his left shoulder.  Did not strike his head.  No numbness or weakness got himself back in the house sat in the chair and realized his shoulder was hurting quite a bit.  Denies injury to the hand forearm or elbow.  No other injuries  Physical Exam   Triage Vital Signs: ED Triage Vitals  Encounter Vitals Group     BP 02/09/24 2012 (!) 190/97     Girls Systolic BP Percentile --      Girls Diastolic BP Percentile --      Boys Systolic BP Percentile --      Boys Diastolic BP Percentile --      Pulse Rate 02/09/24 2012 (!) 120     Resp 02/09/24 2012 20     Temp 02/09/24 2012 98.4 F (36.9 C)     Temp src --      SpO2 02/09/24 2012 96 %     Weight 02/09/24 2011 (!) 315 lb (142.9 kg)     Height 02/09/24 2011 6' 1 (1.854 m)     Head Circumference --      Peak Flow --      Pain Score 02/09/24 2011 10     Pain Loc --      Pain Education --      Exclude from Growth Chart --     Most recent vital signs: Vitals:   02/09/24 2012 02/09/24 2309  BP: (!) 190/97 (!) 139/102  Pulse: (!) 120 (!) 105  Resp: 20 (!) 22  Temp: 98.4 F (36.9 C) 98.8 F (37.1 C)  SpO2: 96% 97%     General: Awake, no distress.  Sitting up on bed does not appear in any distress.  He is very pleasant accompanied by his daughter CV:   Good peripheral perfusion. Normal rate and heart tones.  Warm well-perfused left  hand strong left radial pulse Resp:   Normal effort. Lung sounds clear bilateral. Speaking without distress. Abd:   No distention. Soft, non-tender to palpation in all quadrants. No rebound or guarding. Neuro:   No focal neuro deficits noted. Moves extremities well without noted concern. Other:  No motor deficits normal median ulnar radial neuro motor exam of the left hand.  He has tenderness about the left shoulder and slight contusion.  He is able to range the elbow wrist without difficulty but when he does move the elbow he reports it is not painful in the elbow itself but radiates the pain up towards his left shoulder.  He is tender to palpation over the left shoulder.  Strap muscle testing reveals difficulty with abduction in particular causing pain across the shoulder region.  Limited exam due to pain but demonstrates normal motor exam there is no obvious deformities.  The clavicle is normal the chest wall is  normal lung sounds are clear.  No cervical tenderness.  Normocephalic atraumatic full range of motion of neck without pain   ED Results / Procedures / Treatments   Labs (all labs ordered are listed, but only abnormal results are displayed) Labs Reviewed - No data to display   RADIOLOGY I independently reviewed images of her left forearm humerus and shoulder, no acute findings noted on my inspection  DG Forearm Left Result Date: 02/09/2024 EXAM: 2 VIEW(S) XRAY OF THE LEFT FOREARM 02/09/2024 08:30:00 PM COMPARISON: None available. CLINICAL HISTORY: Fall. FINDINGS: BONES AND JOINTS: No acute fracture. No malalignment. SOFT TISSUES: Unremarkable. IMPRESSION: 1. No acute fracture or dislocation. Electronically signed by: Morgane Naveau MD 02/09/2024 08:34 PM EST RP Workstation: [id number]   DG Humerus Left Result Date: 02/09/2024 EXAM: 1 VIEW XRAY OF THE LEFT HUMERUS 02/09/2024 08:30:00 PM COMPARISON: None available. CLINICAL HISTORY: Fall. FINDINGS: BONES AND JOINTS: No acute fracture. No  malalignment. SOFT TISSUES: Unremarkable. IMPRESSION: 1. No acute fracture or dislocation. Electronically signed by: Morgane Naveau MD 02/09/2024 08:33 PM EST RP Workstation: [id number]   DG Shoulder Left Result Date: 02/09/2024 EXAM: 1 VIEW(S) XRAY OF THE LEFT SHOULDER 02/09/2024 08:30:00 PM COMPARISON: None available. CLINICAL HISTORY: Patient fell. FINDINGS: BONES AND JOINTS: Glenohumeral joint is normally aligned. No acute fracture. No malalignment. The [facility] joint is unremarkable. SOFT TISSUES: No abnormal calcifications. Visualized lung is unremarkable. IMPRESSION: 1. No evidence of acute traumatic injury. Electronically signed by: Elsie Gravely MD 02/09/2024 08:33 PM EST RP Workstation: [id number]       PROCEDURES:  Critical Care performed: No  Procedures   MEDICATIONS ORDERED IN ED: Medications  oxyCODONE -acetaminophen  (PERCOCET/ROXICET) 5-325 MG per tablet 2 tablet (2 tablets Oral Given 02/09/24 2354)     IMPRESSION / MDM / ASSESSMENT AND PLAN / ED COURSE  I reviewed the triage vital signs and the nursing notes.                              Based on presentation, the differential diagnosis includes, but is not limited to key considerations:  Fracture, dislocation, contusion, muscle strain or sprain, etc.  No evidence of acute neurovascular or motor deficit.  No obvious long bone injuries.  Exam concerning for possible strap muscle injury.  Discussed with patient briefly will utilize sling for pain relief, oxycodone , and he will plan to follow-up close with orthopedics for further evaluation.  At this time he is stable for discharge from the ER.  He understands the plan to follow-up with orthopedics for further testing and evaluation.  Patient's presentation is most consistent with acute complicated illness / injury requiring diagnostic workup.    Clinical Course as of 02/09/24 2358  Mon Feb 09, 2024  2336 Discussed careful return precautions with patient.  He is  agreeable.  His daughter is driving him home.  Reviewed safe use of Percocet, he has used this before for his ankle fracture with good relief and no complication [MQ]  2337 Also discussed with patient his blood pressure being elevated, however he relates that likely secondary to pain.  He is on treatment for it and monitors this.  He has no signs or symptoms suggestive of hypertensive urgency and I suspect in part pain is likely a portion of his elevated blood pressure today.  He will be following up with primary care and orthopedic [MQ]    Clinical Course User Index [MQ] Dicky Anes, MD     FINAL  CLINICAL IMPRESSION(S) / ED DIAGNOSES   Final diagnoses:  Contusion of left shoulder, initial encounter  Rotator cuff injury, left, initial encounter     Rx / DC Orders   ED Discharge Orders          Ordered    oxyCODONE -acetaminophen  (PERCOCET/ROXICET) 5-325 MG tablet  Every 6 hours PRN        02/09/24 2355             Note:  This document was prepared using Dragon voice recognition software and may include unintentional dictation errors.   Dicky Anes, MD 02/09/24 2358  "

## 2024-02-09 NOTE — ED Triage Notes (Signed)
 Pt reports he slipped on the ice and fell injuring left shoulder. Pt reports he is unable to life arm.

## 2024-02-13 ENCOUNTER — Inpatient Hospital Stay: Admitting: Physician Assistant

## 2024-02-13 DIAGNOSIS — Z1159 Encounter for screening for other viral diseases: Secondary | ICD-10-CM

## 2024-02-13 DIAGNOSIS — I10 Essential (primary) hypertension: Secondary | ICD-10-CM

## 2024-02-13 DIAGNOSIS — E7800 Pure hypercholesterolemia, unspecified: Secondary | ICD-10-CM

## 2024-02-13 DIAGNOSIS — Z23 Encounter for immunization: Secondary | ICD-10-CM

## 2024-02-13 DIAGNOSIS — E119 Type 2 diabetes mellitus without complications: Secondary | ICD-10-CM

## 2024-02-16 ENCOUNTER — Other Ambulatory Visit: Payer: Self-pay

## 2024-02-16 ENCOUNTER — Inpatient Hospital Stay: Admitting: Physician Assistant

## 2024-02-16 DIAGNOSIS — R2242 Localized swelling, mass and lump, left lower limb: Secondary | ICD-10-CM

## 2024-02-16 DIAGNOSIS — I10 Essential (primary) hypertension: Secondary | ICD-10-CM

## 2024-02-16 MED ORDER — HYDROCHLOROTHIAZIDE 12.5 MG PO CAPS: 12.5000 mg | ORAL_CAPSULE | Freq: Every day | ORAL | 1 refills | Status: AC

## 2024-02-16 MED ORDER — AMLODIPINE BESYLATE 5 MG PO TABS: 5.0000 mg | ORAL_TABLET | Freq: Every day | ORAL | 1 refills | Status: AC

## 2024-02-16 NOTE — Telephone Encounter (Signed)
-----   Message from [place] [facility] sent at 02/15/2024  6:02 PM EST ----- Good afternoon,  Spoke with patient today and he stated that he was out of his blood pressure pills and also wanted to know if he could have some ibuprofen  as well called in for pain.  Thanks [place],

## 2024-02-17 ENCOUNTER — Encounter: Payer: Self-pay | Admitting: Podiatry

## 2024-02-17 ENCOUNTER — Ambulatory Visit (INDEPENDENT_AMBULATORY_CARE_PROVIDER_SITE_OTHER): Payer: Worker's Compensation | Admitting: Podiatry

## 2024-02-17 VITALS — Ht 73.0 in | Wt 315.0 lb

## 2024-02-17 DIAGNOSIS — S82832A Other fracture of upper and lower end of left fibula, initial encounter for closed fracture: Secondary | ICD-10-CM

## 2024-02-17 DIAGNOSIS — M65972 Unspecified synovitis and tenosynovitis, left ankle and foot: Secondary | ICD-10-CM

## 2024-02-17 MED ORDER — OXYCODONE-ACETAMINOPHEN 5-325 MG PO TABS: 1.0000 | ORAL_TABLET | Freq: Three times a day (TID) | ORAL | 0 refills | Status: AC | PRN

## 2024-02-17 NOTE — Progress Notes (Signed)
 "  Chief Complaint  Patient presents with   Ankle Injury    Pt is here to f/u on left ankle injury and discuss MRI results, states the pain is off and on depending on his day and how much he is on the foot.    HPI: 49 y.o. male PSocHx smoker 1-2ppd x 20+ yrs presenting today for follow-up evaluation regarding work-related ankle fracture left.  He has started physical therapy and continues to have significant pain and tenderness with swelling.  Last visit MRI was ordered  Brief history: DOI: 09/02/2023.  Patient states that he caught his left foot and ankle between a wall and a forklift and was crushed.  He went to the emergency department diagnosed with comminuted nondisplaced fracture of the lateral fibular malleolus.    Past Medical History:  Diagnosis Date   Allergies    Hypertension     Past Surgical History:  Procedure Laterality Date   COLONOSCOPY WITH PROPOFOL  N/A 11/08/2021   Procedure: COLONOSCOPY WITH PROPOFOL ;  Surgeon: Therisa Bi, MD;  Location: [facility] ENDOSCOPY;  Service: Gastroenterology;  Laterality: N/A;    No Known Allergies   Physical Exam: General: The patient is alert and oriented x3 in no acute distress.  Dermatology: No open wounds noted  Vascular: Edema noted diffusely throughout the lateral aspect of the left ankle.  Pulses are palpable.  Skin is warm to touch.  VAS US  LOWER EXTREMITY ARTERIAL DUPLEX (Accession [id number]) (Order [id number]) Vascular Ultrasound Date: 09/30/2023 Department: [facility] Released By: Wendee Harlene CROME Authorizing: Janit Thresa HERO, DPM  +----------+--------+-----+--------+---------+--------+  LEFT     PSV cm/sRatioStenosisWaveform Comments  +----------+--------+-----+--------+---------+--------+  CFA Mid   111                  triphasic          +----------+--------+-----+--------+---------+--------+  DFA      41                   triphasic           +----------+--------+-----+--------+---------+--------+  SFA Prox  78                   triphasic          +----------+--------+-----+--------+---------+--------+  SFA Mid   60                   triphasic          +----------+--------+-----+--------+---------+--------+  SFA Distal97                   triphasic          +----------+--------+-----+--------+---------+--------+  POP Distal64                   triphasic          +----------+--------+-----+--------+---------+--------+  ATA Distal87                   triphasic          +----------+--------+-----+--------+---------+--------+  PTA Distal67                   triphasic          +----------+--------+-----+--------+---------+--------+  Summary: Left: Normal flow throughout. No evidence of atherosclerosis.   VAS US  LOWER EXTREMITY VENOUS (DVT) (Accession [id number]) (Order [id number]) Vascular Ultrasound Date: 09/30/2023 Department: [facility] Released By: Wendee Harlene CROME Authorizing: Janit Thresa HERO, DPM  LEFT: - No evidence  of deep vein thrombosis in the lower extremity. No indirect evidence of obstruction proximal to the inguinal ligament.   Neurological: Grossly intact via light touch  Musculoskeletal Exam: Gross alignment of the foot and ankle.  There continues to be tenderness with swelling to the lateral aspect of the left ankle  DG Ankle Complete Left 09/02/2023 IMPRESSION: Questionable nondisplaced lateral malleolar fracture. This could be further evaluated with CT if felt clinically indicated. Diffuse soft tissue swelling about the ankle.  CT Ankle Left Wo Contrast 09/02/2023 IMPRESSION: 1. Acute mildly comminuted nondisplaced fracture involving the lateral fibular malleolus below the level of the mortise. 2. Acute nondisplaced fracture involving the sustentaculum tali of the calcaneus. 3. Considerable soft tissue edema at the lower leg, ankle and foot. 4.  PSocHx smoker 1ppd x 31 years  MR FOOT LEFT WO CONTRAST (Accession [id number]) (Order [id number]) Imaging Date: 01/05/2024 Department: [facility] Released By: everitt Clint Caryle Dionisio, Rosalinda Authorizing: Janit Thresa HERO, DPM  IMPRESSION: 1. Ununited fracture of the lateral malleolus with sclerotic appearing fracture margins. No evidence of callus formation or osseous bridging. 2. Healed fracture of the sustentaculum tali of the calcaneus. 3. 7 x 5 mm region of subchondral marrow edema at the medial talar dome with overlying cartilage irregularity, suspicious for osteochondral defect. 4. Edema within the extensor digitorum brevis muscle along the dorsal foot with trace fluid surrounding the extensor digitorum brevis tendons at the midfoot. 5. Mild distal Achilles tendinosis. 6. Thickening of the anterior talofibular ligament with intermediate signal is compatible with sprain/sequela of prior injury. 7. Diffuse subcutaneous edema of the ankle most pronounced laterally and medially extending through the dorsal foot.   Assessment/Plan of Care: 1.  Nondisplaced comminuted ankle fracture fibular malleolus and sustentaculum tali calcaneus left.  DOI: 09/02/2023 2.  Nonunion distal fibular fracture left  -Patient evaluated.  MRI reviewed.  - The patient continues to have pain and tenderness associated to the ankle and MRI confirms nonunion across the distal fibular fracture.  Discussed the importance of smoking cessation which is likely contributory to the nonunion of the distal fibula fracture.  At this time I do not suspect that the fracture will heal conservatively.  Discussed the need for surgical ORIF to freshen the fracture margins and internally fixate.  At this time we would also need to initiate external bone stimulator to increase the chance of fusion across the fracture fragment.  Also recommend ankle arthroscopy to evaluate the ankle and debridement across the tibiotalar  joint.  This was discussed in detail with the patient.  The procedure was discussed as well as risk benefits advantages and disadvantages.  No guarantees were expressed or implied.  All patient questions were answered.  He consents and would like to proceed with surgery -Authorization for surgery was initiated today.  Surgery will consist of ankle arthroscopy left.  Open reduction with internal fixation nonunion left ankle.  Application of external bone stimulator left. -Okay to discontinue physical therapy for now until it is warranted postoperatively -Continue to refrain from work -Return to clinic 1 week postop  Thresa EMERSON Janit, DPM [facility]  Dr. Thresa EMERSON Janit, DPM    [address], [place] [address]  Office [phone]  Fax [phone]     "

## 2024-02-17 NOTE — Patient Instructions (Signed)

## 2024-02-17 NOTE — Addendum Note (Signed)
 Addended by: JANIT THRESA HERO on: 02/17/2024 05:11 PM   Modules accepted: Orders

## 2024-02-19 ENCOUNTER — Telehealth: Payer: Self-pay | Admitting: Podiatry

## 2024-02-19 NOTE — Telephone Encounter (Signed)
 Mess from Diane H- [employer] rep and she needs copy of recent office visit notes. I faxed to her [phone]

## 2024-02-20 ENCOUNTER — Inpatient Hospital Stay: Admitting: Physician Assistant

## 2024-02-25 ENCOUNTER — Inpatient Hospital Stay: Admitting: Physician Assistant
